# Patient Record
Sex: Female | Born: 1965 | Race: White | Hispanic: No | Marital: Married | Smoking: Former smoker
Health system: Southern US, Community
[De-identification: ages and names within clinical notes are randomized; demographics above are authoritative.]

## PROBLEM LIST (undated history)

## (undated) DIAGNOSIS — Z973 Presence of spectacles and contact lenses: Secondary | ICD-10-CM

## (undated) DIAGNOSIS — Z9889 Other specified postprocedural states: Secondary | ICD-10-CM

## (undated) DIAGNOSIS — D509 Iron deficiency anemia, unspecified: Secondary | ICD-10-CM

## (undated) DIAGNOSIS — K5909 Other constipation: Secondary | ICD-10-CM

## (undated) DIAGNOSIS — N920 Excessive and frequent menstruation with regular cycle: Secondary | ICD-10-CM

## (undated) DIAGNOSIS — G43909 Migraine, unspecified, not intractable, without status migrainosus: Secondary | ICD-10-CM

## (undated) DIAGNOSIS — D259 Leiomyoma of uterus, unspecified: Secondary | ICD-10-CM

## (undated) DIAGNOSIS — R112 Nausea with vomiting, unspecified: Secondary | ICD-10-CM

## (undated) DIAGNOSIS — M1990 Unspecified osteoarthritis, unspecified site: Secondary | ICD-10-CM

## (undated) HISTORY — PX: ABDOMINAL HYSTERECTOMY: SHX81

## (undated) HISTORY — PX: BREAST BIOPSY: SHX20

## (undated) HISTORY — DX: Leiomyoma of uterus, unspecified: D25.9

---

## 1998-10-05 ENCOUNTER — Other Ambulatory Visit: Admission: RE | Admit: 1998-10-05 | Discharge: 1998-10-05 | Payer: Self-pay | Admitting: Family Medicine

## 2000-03-13 ENCOUNTER — Other Ambulatory Visit: Admission: RE | Admit: 2000-03-13 | Discharge: 2000-03-13 | Payer: Self-pay | Admitting: Family Medicine

## 2000-03-20 ENCOUNTER — Encounter: Payer: Self-pay | Admitting: Family Medicine

## 2000-03-20 ENCOUNTER — Encounter: Admission: RE | Admit: 2000-03-20 | Discharge: 2000-03-20 | Payer: Self-pay | Admitting: Family Medicine

## 2001-05-16 ENCOUNTER — Other Ambulatory Visit: Admission: RE | Admit: 2001-05-16 | Discharge: 2001-05-16 | Payer: Self-pay | Admitting: Family Medicine

## 2003-01-29 ENCOUNTER — Other Ambulatory Visit: Admission: RE | Admit: 2003-01-29 | Discharge: 2003-01-29 | Payer: Self-pay | Admitting: Family Medicine

## 2004-06-09 ENCOUNTER — Ambulatory Visit: Payer: Self-pay | Admitting: Family Medicine

## 2005-02-16 ENCOUNTER — Ambulatory Visit: Payer: Self-pay | Admitting: Family Medicine

## 2005-06-13 ENCOUNTER — Ambulatory Visit: Payer: Self-pay | Admitting: Family Medicine

## 2005-07-11 ENCOUNTER — Encounter: Payer: Self-pay | Admitting: Family Medicine

## 2005-07-11 ENCOUNTER — Other Ambulatory Visit: Admission: RE | Admit: 2005-07-11 | Discharge: 2005-07-11 | Payer: Self-pay | Admitting: Family Medicine

## 2005-07-11 ENCOUNTER — Ambulatory Visit: Payer: Self-pay | Admitting: Family Medicine

## 2005-07-11 LAB — CONVERTED CEMR LAB: Pap Smear: NORMAL

## 2005-07-31 HISTORY — PX: COLONOSCOPY: SHX174

## 2005-08-08 ENCOUNTER — Encounter: Admission: RE | Admit: 2005-08-08 | Discharge: 2005-08-08 | Payer: Self-pay | Admitting: Family Medicine

## 2005-08-18 ENCOUNTER — Ambulatory Visit: Payer: Self-pay | Admitting: Family Medicine

## 2005-08-31 LAB — HM COLONOSCOPY

## 2005-09-07 ENCOUNTER — Ambulatory Visit: Payer: Self-pay | Admitting: Internal Medicine

## 2005-09-21 ENCOUNTER — Ambulatory Visit: Payer: Self-pay | Admitting: Internal Medicine

## 2006-06-18 LAB — FECAL OCCULT BLOOD, GUAIAC: Fecal Occult Blood: POSITIVE

## 2007-02-14 ENCOUNTER — Ambulatory Visit: Payer: Self-pay | Admitting: Family Medicine

## 2007-02-14 DIAGNOSIS — G43909 Migraine, unspecified, not intractable, without status migrainosus: Secondary | ICD-10-CM | POA: Insufficient documentation

## 2007-02-14 DIAGNOSIS — J3089 Other allergic rhinitis: Secondary | ICD-10-CM | POA: Insufficient documentation

## 2007-02-14 DIAGNOSIS — B078 Other viral warts: Secondary | ICD-10-CM | POA: Insufficient documentation

## 2007-08-16 ENCOUNTER — Telehealth (INDEPENDENT_AMBULATORY_CARE_PROVIDER_SITE_OTHER): Payer: Self-pay | Admitting: *Deleted

## 2010-01-24 ENCOUNTER — Other Ambulatory Visit: Admission: RE | Admit: 2010-01-24 | Discharge: 2010-01-24 | Payer: Self-pay | Admitting: Family Medicine

## 2010-01-24 ENCOUNTER — Ambulatory Visit: Payer: Self-pay | Admitting: Family Medicine

## 2010-01-24 DIAGNOSIS — S91309A Unspecified open wound, unspecified foot, initial encounter: Secondary | ICD-10-CM | POA: Insufficient documentation

## 2010-01-24 DIAGNOSIS — R079 Chest pain, unspecified: Secondary | ICD-10-CM

## 2010-01-24 LAB — HM PAP SMEAR

## 2010-01-28 ENCOUNTER — Encounter: Admission: RE | Admit: 2010-01-28 | Discharge: 2010-01-28 | Payer: Self-pay | Admitting: Family Medicine

## 2010-01-28 LAB — HM MAMMOGRAPHY: HM Mammogram: NORMAL

## 2010-02-03 ENCOUNTER — Encounter (INDEPENDENT_AMBULATORY_CARE_PROVIDER_SITE_OTHER): Payer: Self-pay | Admitting: *Deleted

## 2010-04-08 ENCOUNTER — Ambulatory Visit: Payer: Self-pay | Admitting: Family Medicine

## 2010-07-26 ENCOUNTER — Ambulatory Visit
Admission: RE | Admit: 2010-07-26 | Discharge: 2010-07-26 | Payer: Self-pay | Source: Home / Self Care | Attending: Internal Medicine | Admitting: Internal Medicine

## 2010-07-26 DIAGNOSIS — L570 Actinic keratosis: Secondary | ICD-10-CM

## 2010-08-28 LAB — CONVERTED CEMR LAB
ALT: 22 units/L (ref 0–35)
AST: 18 units/L (ref 0–37)
Albumin: 3.9 g/dL (ref 3.5–5.2)
Alkaline Phosphatase: 46 units/L (ref 39–117)
BUN: 15 mg/dL (ref 6–23)
Basophils Absolute: 0 10*3/uL (ref 0.0–0.1)
Basophils Relative: 0.4 % (ref 0.0–3.0)
Bilirubin, Direct: 0.1 mg/dL (ref 0.0–0.3)
CO2: 27 meq/L (ref 19–32)
Calcium: 9 mg/dL (ref 8.4–10.5)
Chloride: 107 meq/L (ref 96–112)
Cholesterol: 175 mg/dL (ref 0–200)
Creatinine, Ser: 0.9 mg/dL (ref 0.4–1.2)
Eosinophils Absolute: 0.1 10*3/uL (ref 0.0–0.7)
Eosinophils Relative: 1.7 % (ref 0.0–5.0)
GFR calc non Af Amer: 76.57 mL/min (ref >60)
Glucose, Bld: 88 mg/dL (ref 70–99)
HCT: 34.8 % — ABNORMAL LOW (ref 36.0–46.0)
HDL: 44.8 mg/dL (ref >39.00)
Hemoglobin: 11.6 g/dL — ABNORMAL LOW (ref 12.0–15.0)
LDL Cholesterol: 109 mg/dL — ABNORMAL HIGH (ref 0–99)
Lymphocytes Relative: 23.6 % (ref 12.0–46.0)
Lymphs Abs: 2.1 10*3/uL (ref 0.7–4.0)
MCHC: 33.3 g/dL (ref 30.0–36.0)
MCV: 82.1 fL (ref 78.0–100.0)
Monocytes Absolute: 0.6 10*3/uL (ref 0.1–1.0)
Monocytes Relative: 7.3 % (ref 3.0–12.0)
Neutro Abs: 5.8 10*3/uL (ref 1.4–7.7)
Neutrophils Relative %: 67 % (ref 43.0–77.0)
Platelets: 273 10*3/uL (ref 150.0–400.0)
Potassium: 5 meq/L (ref 3.5–5.1)
RBC: 4.24 M/uL (ref 3.87–5.11)
RDW: 15.1 % — ABNORMAL HIGH (ref 11.5–14.6)
Sodium: 141 meq/L (ref 135–145)
TSH: 1.63 microintl units/mL (ref 0.35–5.50)
Total Bilirubin: 0.6 mg/dL (ref 0.3–1.2)
Total CHOL/HDL Ratio: 4
Total Protein: 6.7 g/dL (ref 6.0–8.3)
Triglycerides: 108 mg/dL (ref 0.0–149.0)
VLDL: 21.6 mg/dL (ref 0.0–40.0)
WBC: 8.7 10*3/uL (ref 4.5–10.5)

## 2010-09-01 NOTE — Assessment & Plan Note (Signed)
Summary: CPX/WPAP/RBH   Vital Signs:  Patient profile:   45 year old female Height:      64.75 inches Weight:      180.75 pounds BMI:     30.42 Temp:     98.2 degrees F oral Pulse rate:   64 / minute Pulse rhythm:   regular BP sitting:   110 / 66  (left arm) Cuff size:   regular  Vitals Entered By: Lewanda Rife LPN (January 24, 2010 10:35 AM) CC: CPX with pap and breast exam LMP 01/06/10   History of Present Illness: here for health mt and gyn exam and to rev chronic med problems   is feeling ok in general   has a spot on back to check - does irritate her   occ pain in R side of chest on and off since oct  is a dull ache  not exertional at all  is a soreness she can feel every day -- feels like muscle strain -- did try to change her bra and that did help  no trauma to the area  no nausea / sweating or other symptoms not sob   lower back bothers her for 2 years - went to [facility] ortho -- did some PT -- that helped   wt is up 8 lb  bp is 110/66  colonosc 07(fam hx colon cancer)  last one was Dr Juanda Chance   pap 06-- is overdue for that    menses-- pretty normal and regular - no problems  uterine fibroids do not bother her much   mam 07-- due for her yearly self exam - no lumps   Td 04  wants to do wellness labs today- is fasting   injured foot 3 weeks ago - waterskiing -- healing abrasion  ? needs Td   Allergies: 1)  Penicillin 2)  Erythromycin 3)  Amoxicillin  Past History:  Past Surgical History: Last updated: 02/14/2007 Pelvic US- neg (02/2000) Toe fracture (12/2002) Colonoscopy- neg (08/2005)  Family History: Last updated: 02/14/2007 Father: colon cancer Mother: back surgery, breast cysts, cholesterol Siblings: 1 brother, 1 sister  Social History: Last updated: 02/14/2007 Marital Status: Married Children: 2 Occupation: dentalhygienist  Risk Factors: Smoking Status: quit (02/14/2007)  Past Medical History: uterine fibroids  Review of  Systems General:  Denies fatigue, loss of appetite, and malaise. Eyes:  Denies blurring and eye pain. CV:  Complains of chest pain or discomfort; denies lightheadness, palpitations, and shortness of breath with exertion. Resp:  Denies cough, shortness of breath, and wheezing. GI:  Denies abdominal pain and indigestion. GU:  Denies abnormal vaginal bleeding, dysuria, and urinary frequency. MS:  Denies joint pain, joint redness, and joint swelling. Derm:  Denies itching, lesion(s), poor wound healing, and rash. Neuro:  Denies numbness and tingling. Psych:  Denies anxiety and depression. Endo:  Denies cold intolerance, excessive thirst, excessive urination, and heat intolerance. Heme:  Denies abnormal bruising and bleeding.  Physical Exam  General:  overweight but generally well appearing  Head:  normocephalic, atraumatic, and no abnormalities observed.   Eyes:  vision grossly intact, pupils equal, pupils round, and pupils reactive to light.  no conjunctival pallor, injection or icterus  Ears:  R ear normal and L ear normal.   Nose:  no nasal discharge.   Mouth:  pharynx pink and moist.   Neck:  supple with full rom and no masses or thyromegally, no JVD or carotid bruit  Chest Wall:  some tenderness over ant R chest  wall - no crepitice or skin change  Breasts:  No mass, nodules, thickening, tenderness, bulging, retraction, inflamation, nipple discharge or skin changes noted.   Lungs:  Normal respiratory effort, chest expands symmetrically. Lungs are clear to auscultation, no crackles or wheezes. Heart:  Normal rate and regular rhythm. S1 and S2 normal without gallop, murmur, click, rub or other extra sounds. Abdomen:  Bowel sounds positive,abdomen soft and non-tender without masses, organomegaly or hernias noted. Genitalia:  Normal introitus for age, no external lesions, no vaginal discharge, mucosa pink and moist, no vaginal or cervical lesions, no vaginal atrophy, no friaility or  hemorrhage, normal uterus size and position, no adnexal masses or tenderness uterine fibroids noted to left  Msk:  No deformity or scoliosis noted of thoracic or lumbar spine.  no acute joint changes  Pulses:  R and L carotid,radial,femoral,dorsalis pedis and posterior tibial pulses are full and equal bilaterally Extremities:  No clubbing, cyanosis, edema, or deformity noted with normal full range of motion of all joints.   Neurologic:  sensation intact to light touch, gait normal, and DTRs symmetrical and normal.   Skin:  Intact without suspicious lesions or rashes lentigos diffusely  dermatofibroma 2-3 mm R back   some patches of dry skin on hips bilat - baseline Cervical Nodes:  No lymphadenopathy noted Axillary Nodes:  No palpable lymphadenopathy Inguinal Nodes:  No significant adenopathy Psych:  normal affect, talkative and pleasant    Impression & Recommendations:  Problem # 1:  HEALTH MAINTENANCE EXAM (ICD-V70.0) Assessment Comment Only reviewed health habits including diet, exercise and skin cancer prevention reviewed health maintenance list and family history  wellness labs today Orders: Venipuncture (78469) TLB-Lipid Panel (80061-LIPID) TLB-BMP (Basic Metabolic Panel-BMET) (80048-METABOL) TLB-CBC Platelet - w/Differential (85025-CBCD) TLB-Hepatic/Liver Function Pnl (80076-HEPATIC) TLB-TSH (Thyroid Stimulating Hormone) (84443-TSH)  Problem # 2:  ROUTINE GYNECOLOGICAL EXAMINATION (ICD-V72.31) Assessment: Comment Only annual exam and pap  fibroids not symptomatic   Problem # 3:  WOUND, FOOT (ICD-892.0) Assessment: New looks to be healing well  update Td adv keeping clean and use of abx oint otc   Problem # 4:  CHEST PAIN (ICD-786.50) Assessment: New  with tenderness/ suspect muscular  nl ekg and exam  adv use of heat/ stretches and update if worse/ no imp or other new symptoms   Orders: EKG w/ Interpretation (93000)  Complete Medication List: 1)  Allegra  60 Mg Tabs (Fexofenadine hcl) .... One by mouth daily prn 2)  Midrin 325-65-100 Mg Caps (Apap-isometheptene-dichloral) .Marland Kitchen.. 1-2 pills q 6 hours as needed migraine 3)  Ibuprofen 200 Mg Tabs (Ibuprofen) .... Otc as directed.  Other Orders: Radiology Referral (Radiology) TD Toxoids IM 7 YR + [phone]) Admin 1st Vaccine (84132)  Patient Instructions: 1)  tetnus shot today  2)  keep wound on foot clean and dry / use over the counter antibiotic ointment until healed  3)  we will set up mammogram at check out  4)  labs today for wellness  5)  work on healthy diet and exercise  6)  schedule follow up for wart treatment please   Current Allergies (reviewed today): PENICILLIN ERYTHROMYCIN AMOXICILLIN    EKG  Procedure date:  01/24/2010  Findings:      NSR with rate of 64 and no acute changes     Immunizations Administered:  Tetanus Vaccine:    Vaccine Type: Td    Site: left deltoid    Mfr: Sanofi Pasteur    Dose: 0.5 ml    Route: IM  Given by: Lewanda Rife LPN    Exp. Date: 08/26/2011    Lot #: [id number]    VIS given: 06/18/07 version given January 24, 2010.

## 2010-09-01 NOTE — Letter (Signed)
Summary: Results Follow up Letter   at [facility]  [address] [place], [place] [address]   Phone: [phone]  Fax: [phone]    02/03/2010 MRN: [id number]    LAQUASIA PINCUS [address] [place], [place]  [address]    Dear Ms. Scriven,  The following are the results of your recent test(s):  Test         Result    Pap Smear:        Normal _____  Not Normal _____ Comments: ______________________________________________________ Cholesterol: LDL(Bad cholesterol):         Your goal is less than:         HDL (Good cholesterol):       Your goal is more than: Comments:  ______________________________________________________ Mammogram:        Normal __X___  Not Normal _____ Comments:  Yearly follow up is recommended.   ___________________________________________________________________ Hemoccult:        Normal _____  Not normal _______ Comments:    _____________________________________________________________________ Other Tests:    We routinely do not discuss normal results over the telephone.  If you desire a copy of the results, or you have any questions about this information we can discuss them at your next office visit.   Sincerely,   Marne A. Milinda Antis, M.D.  MAT:lsf

## 2010-09-01 NOTE — Letter (Signed)
Summary: Results Follow up Letter   at [facility]  [address] [place], [place] [address]   Phone: [phone]  Fax: [phone]    02/03/2010 MRN: [id number]  CRISTAN HOUT [address] [place], [place]  [address]  Dear Ms. Hevia,  The following are the results of your recent test(s):  Test         Result    Pap Smear:        Normal __X___  Not Normal _____ Comments: ______________________________________________________ Cholesterol: LDL(Bad cholesterol):         Your goal is less than:         HDL (Good cholesterol):       Your goal is more than: Comments:  ______________________________________________________ Mammogram:        Normal _____  Not Normal _____ Comments:  ___________________________________________________________________ Hemoccult:        Normal _____  Not normal _______ Comments:    _____________________________________________________________________ Other Tests:    We routinely do not discuss normal results over the telephone.  If you desire a copy of the results, or you have any questions about this information we can discuss them at your next office visit.   Sincerely,     Roxy Manns, MD

## 2010-09-01 NOTE — Assessment & Plan Note (Signed)
Summary: 9:45 CHECK PLACE ON CHEST/CLE   Vital Signs:  Patient profile:   45 year old female Height:      64.75 inches Weight:      184.25 pounds BMI:     31.01 Temp:     98.4 degrees F oral Pulse rate:   84 / minute Pulse rhythm:   regular BP sitting:   110 / 70  (left arm) Cuff size:   regular  Vitals Entered By: Delilah Shan CMA Duncan Dull) (July 26, 2010 9:51 AM) CC: Check place on chest   History of Present Illness: Has mole on upper chest was formerly a mole with a halo now has changed and has scaly area on it "Looked like a blister with a hole in it" Very itchy some redness  tried to cover it with bandaid no other Rx  Allergies: 1)  Penicillin 2)  Erythromycin 3)  Amoxicillin  Past History:  Past medical, surgical, family and social histories (including risk factors) reviewed for relevance to current acute and chronic problems.  Past Medical History: Reviewed history from 01/24/2010 and no changes required. uterine fibroids  Past Surgical History: Reviewed history from 02/14/2007 and no changes required. Pelvic US- neg (02/2000) Toe fracture (12/2002) Colonoscopy- neg (08/2005)  Family History: Reviewed history from 02/14/2007 and no changes required. Father: colon cancer Mother: back surgery, breast cysts, cholesterol Siblings: 1 brother, 1 sister  Social History: Reviewed history from 02/14/2007 and no changes required. Marital Status: Married Children: 2 Occupation: dentalhygienist  Review of Systems       No history of skin cancer but did have spot under eye removed as child wears sun block all the time  Physical Exam  Skin:   ~42mm scaly red area on upper chest slight darker discoloration to right    Impression & Recommendations:  Problem # 1:  ACTINIC KERATOSIS (ICD-702.0) Assessment New  Discussed alternatives Rx done with liquid nitrogen 50 seconds x 2 tolerated well discussed home care  Orders: Cryotherapy/Destruction  benign or premalignant lesion (1st lesion)  (17000)  Complete Medication List: 1)  Allegra 60 Mg Tabs (Fexofenadine hcl) .... One by mouth daily prn 2)  Midrin 325-65-100 Mg Caps (Apap-isometheptene-dichloral) .Marland Kitchen.. 1-2 pills q 6 hours as needed migraine 3)  Ibuprofen 200 Mg Tabs (Ibuprofen) .... Otc as directed.  Patient Instructions: 1)  Please schedule a follow-up appointment as needed .    Orders Added: 1)  Cryotherapy/Destruction benign or premalignant lesion (1st lesion)  [17000]    Current Allergies (reviewed today): PENICILLIN ERYTHROMYCIN AMOXICILLIN

## 2010-09-01 NOTE — Assessment & Plan Note (Signed)
Summary: WART TREATMENT / LFW   Vital Signs:  Patient profile:   45 year old female Weight:      182.75 pounds Temp:     98.0 degrees F oral Pulse rate:   86 / minute Pulse rhythm:   regular BP sitting:   102 / 62  (left arm) Cuff size:   large  Vitals Entered By: Selena Batten Dance CMA Duncan Dull) (April 08, 2010 1:56 PM) CC: wart treatment   History of Present Illness: wart is L foot -- is starting to hurt  has had it for well over a year  she trims it down - but no otc products   sore if she walks a lot there are several on either foot  does go barefoot in public   Allergies: 1)  Penicillin 2)  Erythromycin 3)  Amoxicillin  Past History:  Past Medical History: Last updated: 01/24/2010 uterine fibroids  Past Surgical History: Last updated: 02/14/2007 Pelvic US- neg (02/2000) Toe fracture (12/2002) Colonoscopy- neg (08/2005)  Family History: Last updated: 02/14/2007 Father: colon cancer Mother: back surgery, breast cysts, cholesterol Siblings: 1 brother, 1 sister  Social History: Last updated: 02/14/2007 Marital Status: Married Children: 2 Occupation: dentalhygienist  Risk Factors: Smoking Status: quit (02/14/2007)  Review of Systems General:  Denies chills, fatigue, fever, and loss of appetite. CV:  Denies chest pain or discomfort and palpitations. Resp:  Denies cough, shortness of breath, and wheezing. GI:  Denies abdominal pain, change in bowel habits, and indigestion. GU:  Denies genital sores. MS:  Denies joint pain. Derm:  Complains of lesion(s); denies poor wound healing and rash. Heme:  Denies abnormal bruising and bleeding.  Physical Exam  General:  Well-developed,well-nourished,in no acute distress; alert,appropriate and cooperative throughout examination Head:  normocephalic, atraumatic, and no abnormalities observed.   Mouth:  pharynx pink and moist.   Neck:  supple with full rom and no masses or thyromegally, no JVD or carotid bruit    Lungs:  Normal respiratory effort, chest expands symmetrically. Lungs are clear to auscultation, no crackles or wheezes. Heart:  Normal rate and regular rhythm. S1 and S2 normal without gallop, murmur, click, rub or other extra sounds. Skin:  4 plantar warts on surface of L foot these were debrided sterilly with #11 scalpel  then cryo tx with liquid nitrogen freeze and thaw times 3 full (tolerated well) Psych:  normal affect, talkative and pleasant    Impression & Recommendations:  Problem # 1:  PLANTAR WART (ICD-078.19) Assessment Deteriorated  bilat feet - decided to just tx the R foot today  cleaned and sterilly debided 4 plantar warts  tx with liquid nit cryo times 3 full freeze and thaw - pt tolerated well if not resolved in 2 mo will f/u for re treatment disc aftercare in detail  Orders: Wart Destruct <14 ([address])  Complete Medication List: 1)  Allegra 60 Mg Tabs (Fexofenadine hcl) .... One by mouth daily prn 2)  Midrin 325-65-100 Mg Caps (Apap-isometheptene-dichloral) .Marland Kitchen.. 1-2 pills q 6 hours as needed migraine 3)  Ibuprofen 200 Mg Tabs (Ibuprofen) .... Otc as directed.  Patient Instructions: 1)  keep the wart sites clean  2)  use antibiotic ointment if necessary  3)  once healed - work on them with clean pumice stone or foot file after soaking with soap and water  4)  if warts are not gone in 1-2 months follow up for re - treatment  Current Allergies (reviewed today): PENICILLIN ERYTHROMYCIN AMOXICILLIN

## 2011-04-14 ENCOUNTER — Encounter: Payer: Self-pay | Admitting: Internal Medicine

## 2011-04-14 ENCOUNTER — Ambulatory Visit (INDEPENDENT_AMBULATORY_CARE_PROVIDER_SITE_OTHER): Payer: 59 | Admitting: Internal Medicine

## 2011-04-14 VITALS — BP 99/54 | HR 67 | Temp 98.9°F | Wt 177.0 lb

## 2011-04-14 DIAGNOSIS — N390 Urinary tract infection, site not specified: Secondary | ICD-10-CM | POA: Insufficient documentation

## 2011-04-14 DIAGNOSIS — R300 Dysuria: Secondary | ICD-10-CM

## 2011-04-14 LAB — POCT URINALYSIS DIPSTICK
Glucose, UA: NEGATIVE
Ketones, UA: NEGATIVE
Spec Grav, UA: 1.03

## 2011-04-14 MED ORDER — CIPROFLOXACIN HCL 250 MG PO TABS: 250.0000 mg | ORAL_TABLET | Freq: Two times a day (BID) | ORAL | Status: AC

## 2011-04-14 NOTE — Assessment & Plan Note (Signed)
Marked blood and pyuria in urine No sig systemic symptoms Will treat with cipro (has tolerated before) for 10 days She will use the cystex prn

## 2011-04-14 NOTE — Progress Notes (Signed)
  Subjective:    Patient ID: Katie Espinoza, female    DOB: [birth year], 45 y.o.   MRN: [id number]  HPI 5 days ago she noted urinary irritation Had taken a lavender bath  Tried azo Felt better for a while but doubled over in pain today Mild nausea Gross blood in urine  Dysuria and urgency since then  No fever Slight chills and felt sweaty No back pain out of her usual  No current outpatient prescriptions on file prior to visit.    Allergies  Allergen Reactions  . Amoxicillin     REACTION: reaction not known  . Erythromycin     REACTION: reaction not known  . Penicillins     REACTION: reaction not known    Past Medical History  Diagnosis Date  . Uterine fibroid     Past Surgical History  Procedure Date  . Orif toe fracture     Family History  Problem Relation Age of Onset  . Cancer Father     colon    History   Social History  . Marital Status: Married    Spouse Name: N/A    Number of Children: 2  . Years of Education: N/A   Occupational History  . dental hygenist    Social History Main Topics  . Smoking status: Never Smoker   . Smokeless tobacco: Never Used  . Alcohol Use: No  . Drug Use: No  . Sexually Active: Not on file   Other Topics Concern  . Not on file   Social History Narrative  . No narrative on file   Review of Systems No vomiting Able to eat okay Did call into work    Objective:   Physical Exam  Constitutional: She appears well-developed and well-nourished. No distress.  Abdominal: Soft. There is tenderness.       Very mild but not localized tenderness  Musculoskeletal:       No CVA tenderness          Assessment & Plan:

## 2011-05-01 ENCOUNTER — Telehealth: Payer: Self-pay | Admitting: Family Medicine

## 2011-05-01 DIAGNOSIS — Z0000 Encounter for general adult medical examination without abnormal findings: Secondary | ICD-10-CM | POA: Insufficient documentation

## 2011-05-01 NOTE — Telephone Encounter (Signed)
Message copied by [name] on Mon May 01, 2011  9:37 PM ------      Message from: Baldomero Lamy      Created: Mon May 01, 2011 10:04 AM      Regarding: Cpx labs Tues 10/2       Please order  future cpx labs for pt's upcomming lab appt.      Thanks      Rodney Booze

## 2011-05-02 ENCOUNTER — Other Ambulatory Visit (INDEPENDENT_AMBULATORY_CARE_PROVIDER_SITE_OTHER): Payer: 59

## 2011-05-02 DIAGNOSIS — Z0000 Encounter for general adult medical examination without abnormal findings: Secondary | ICD-10-CM

## 2011-05-02 LAB — CBC WITH DIFFERENTIAL/PLATELET
Basophils Absolute: 0 10*3/uL (ref 0.0–0.1)
Eosinophils Absolute: 0.1 10*3/uL (ref 0.0–0.7)
HCT: 33.5 % — ABNORMAL LOW (ref 36.0–46.0)
Hemoglobin: 10.7 g/dL — ABNORMAL LOW (ref 12.0–15.0)
Lymphs Abs: 1.6 10*3/uL (ref 0.7–4.0)
MCHC: 32 g/dL (ref 30.0–36.0)
Monocytes Relative: 8.4 % (ref 3.0–12.0)
Neutro Abs: 4.8 10*3/uL (ref 1.4–7.7)
RDW: 15.9 % — ABNORMAL HIGH (ref 11.5–14.6)

## 2011-05-02 LAB — COMPREHENSIVE METABOLIC PANEL
ALT: 23 U/L (ref 0–35)
BUN: 13 mg/dL (ref 6–23)
CO2: 27 mEq/L (ref 19–32)
Creatinine, Ser: 1 mg/dL (ref 0.4–1.2)
GFR: 66.25 mL/min (ref >60.00)
Total Bilirubin: 0.5 mg/dL (ref 0.3–1.2)

## 2011-05-02 LAB — LIPID PANEL
HDL: 47.5 mg/dL (ref >39.00)
Triglycerides: 114 mg/dL (ref 0.0–149.0)
VLDL: 22.8 mg/dL (ref 0.0–40.0)

## 2011-05-02 LAB — TSH: TSH: 1.25 u[IU]/mL (ref 0.35–5.50)

## 2011-05-05 ENCOUNTER — Encounter: Payer: Self-pay | Admitting: Family Medicine

## 2011-05-08 ENCOUNTER — Telehealth: Payer: Self-pay | Admitting: Family Medicine

## 2011-05-08 ENCOUNTER — Other Ambulatory Visit (HOSPITAL_COMMUNITY)
Admission: RE | Admit: 2011-05-08 | Discharge: 2011-05-08 | Disposition: A | Discharge status: Home / Self Care | Payer: 59 | Source: Ambulatory Visit | Attending: Family Medicine | Admitting: Family Medicine

## 2011-05-08 ENCOUNTER — Ambulatory Visit (INDEPENDENT_AMBULATORY_CARE_PROVIDER_SITE_OTHER): Payer: 59 | Admitting: Family Medicine

## 2011-05-08 ENCOUNTER — Encounter: Payer: Self-pay | Admitting: Family Medicine

## 2011-05-08 DIAGNOSIS — Z01419 Encounter for gynecological examination (general) (routine) without abnormal findings: Secondary | ICD-10-CM | POA: Insufficient documentation

## 2011-05-08 DIAGNOSIS — Z800 Family history of malignant neoplasm of digestive organs: Secondary | ICD-10-CM | POA: Insufficient documentation

## 2011-05-08 DIAGNOSIS — D509 Iron deficiency anemia, unspecified: Secondary | ICD-10-CM | POA: Insufficient documentation

## 2011-05-08 DIAGNOSIS — D649 Anemia, unspecified: Secondary | ICD-10-CM

## 2011-05-08 DIAGNOSIS — Z0000 Encounter for general adult medical examination without abnormal findings: Secondary | ICD-10-CM

## 2011-05-08 DIAGNOSIS — G43909 Migraine, unspecified, not intractable, without status migrainosus: Secondary | ICD-10-CM

## 2011-05-08 MED ORDER — POLYSACCHARIDE IRON COMPLEX 150 MG PO CAPS: 150.0000 mg | ORAL_CAPSULE | Freq: Every day | ORAL | Status: DC

## 2011-05-08 MED ORDER — ISOMETHEPTENE-APAP-DICHLORAL 65-325-100 MG PO CAPS: 1.0000 | ORAL_CAPSULE | Freq: Four times a day (QID) | ORAL | Status: DC | PRN

## 2011-05-08 NOTE — Assessment & Plan Note (Addendum)
Due for her 5 year colonoscopy  Will get that scheduled for Dr Juanda Chance No stool changes  ADDENDUM:-- called Dr Regino Schultze office and turns out she does not recommend colonosc until 2019  So will cancel the order

## 2011-05-08 NOTE — Progress Notes (Signed)
Subjective:    Patient ID: Katie Espinoza, female    DOB: [birth year], 45 y.o.   MRN: [id number]  HPI Here for annual health mt exam and to rev chronic med problems and also disc anemia  Is feeling good overall   occ has a "flip" in her heartbeat  Only notices it when she sits still  Drinks caffiene - she drinks occ tea , avoids soft drinks (at home decaf)  Not more anxious   bp good 100/68 Wt is up 2 lb with bmi of 29  Lipids - LDL is 93 good profile Lab Results  Component Value Date   CHOL 163 05/02/2011   CHOL 175 01/24/2010   Lab Results  Component Value Date   HDL 47.50 05/02/2011   HDL 62.13 01/24/2010   Lab Results  Component Value Date   LDLCALC 93 05/02/2011   LDLCALC 109* 01/24/2010   Lab Results  Component Value Date   TRIG 114.0 05/02/2011   TRIG 108.0 01/24/2010   Lab Results  Component Value Date   CHOLHDL 3 05/02/2011   CHOLHDL 4 01/24/2010   No results found for this basename: LDLDIRECT  does eat a pretty healthy diet in general Recently stopped eating out at lunch - and feels better  Though- has not been walking as much- feels better when she does  Kids are in sports -- does a lot of sitting and watching games  This will end next week   New? Anemia with 10.7 hb Lab Results  Component Value Date   WBC 7.1 05/02/2011   HGB 10.7* 05/02/2011   HCT 33.5* 05/02/2011   MCV 79.1 05/02/2011   PLT 305.0 05/02/2011  from heavy menses and has been anemic before  Is somewhat tired Wonders if palpitations may be assoc with this    Pap 6/11- were normal  Menses- are very heavy  Hx of fibroids - and this makes her bleed heavy  No OC - does not want one   colonosc 2/07- sent her a notice to schedule  Father had colon cancer  Flu shot Td 6/1  Mam 7/11-- knows she is due -- she will set it up herself  Self exam no lumps or changes   Gets free flu shot at work in nov    Patient Active Problem List  Diagnoses  . MIGRAINE HEADACHE  . RHINITIS, ALLERGIC  NEC  . ACTINIC KERATOSIS  . Routine general medical examination at a health care facility  . Anemia  . Family history of colon cancer  . Gynecological examination   Past Medical History  Diagnosis Date  . Uterine fibroid    Past Surgical History  Procedure Date  . Orif toe fracture    History  Substance Use Topics  . Smoking status: Never Smoker   . Smokeless tobacco: Never Used  . Alcohol Use: No   Family History  Problem Relation Age of Onset  . Cancer Father     colon  . Hyperlipidemia Mother    Allergies  Allergen Reactions  . Amoxicillin     REACTION: reaction not known  . Erythromycin     REACTION: reaction not known  . Penicillins     REACTION: reaction not known   Current Outpatient Prescriptions on File Prior to Visit  Medication Sig Dispense Refill  . fexofenadine (ALLEGRA) 60 MG tablet Take 60 mg by mouth daily as needed.        Marland Kitchen ibuprofen (ADVIL,MOTRIN) 200 MG tablet Take 200  mg by mouth every 6 (six) hours as needed.             Review of Systems Review of Systems  Constitutional: Negative for fever, appetite change, and unexpected weight change. some fatigue  Eyes: Negative for pain and visual disturbance.  Respiratory: Negative for cough and shortness of breath.   Cardiovascular: Negative for cp or edema or sob    Gastrointestinal: Negative for nausea, diarrhea and constipation.  Genitourinary: Negative for urgency and frequency.  Skin: Negative for pallor or rash   Neurological: Negative for weakness, light-headedness, numbness and headaches.  Hematological: Negative for adenopathy. Does not bruise/bleed easily.  Psychiatric/Behavioral: Negative for dysphoric mood. The patient is not nervous/anxious.          Objective:   Physical Exam  Constitutional: She appears well-developed and well-nourished. No distress.  HENT:  Head: Normocephalic and atraumatic.  Right Ear: External ear normal.  Left Ear: External ear normal.  Nose: Nose  normal.  Mouth/Throat: Oropharynx is clear and moist.  Eyes: Conjunctivae and EOM are normal. Pupils are equal, round, and reactive to light. No scleral icterus.  Neck: Normal range of motion. Neck supple. No JVD present. Carotid bruit is not present. No thyromegaly present.  Cardiovascular: Normal rate, regular rhythm, normal heart sounds and intact distal pulses.  Exam reveals no gallop.   No murmur heard. Pulmonary/Chest: Effort normal and breath sounds normal. No respiratory distress. She has no wheezes.  Abdominal: Soft. Bowel sounds are normal. She exhibits no distension and no mass. There is no tenderness.  Genitourinary: Vagina normal. No breast swelling, tenderness, discharge or bleeding. No vaginal discharge found.       Uterus is mildly enlarged due to fibroids Pt had no pain or bleeding with exam   Musculoskeletal: Normal range of motion. She exhibits no edema and no tenderness.  Lymphadenopathy:    She has no cervical adenopathy.  Neurological: She is alert. She has normal reflexes. No cranial nerve deficit. Coordination normal.  Skin: Skin is warm and dry. No rash noted. No erythema. No pallor.  Psychiatric: She has a normal mood and affect.          Assessment & Plan:

## 2011-05-08 NOTE — Telephone Encounter (Signed)
Called [facility] to set up the Colonoscopy. They said Dr Lina Sar recommended the next one be in 2019 so they did not schedule her for the procedure. They said they will send her a letter when she is due.

## 2011-05-08 NOTE — Assessment & Plan Note (Signed)
Suspect from heavy menses/ fibroids Start nu iron daily  Lab and f/u in 6 weeks  Urged to update if side eff or problems

## 2011-05-08 NOTE — Patient Instructions (Signed)
Try to avoid caffeine as much as possible  Start Nu iron px once daily - also eat iron rich foods (lean beef/ dark leafy greens) We will refer you for colonoscopy at check out  Don't forget to schedule your mammogram  Schedule follow up in 6 weeks for anemia with lab prior

## 2011-05-08 NOTE — Assessment & Plan Note (Signed)
Refilled her migraine med which she is needing less and less

## 2011-05-08 NOTE — Assessment & Plan Note (Signed)
Pap done and exam  Mildly enl uterus from fibroids - no pain  Pt deals well with heavy menses- does not want any proceedures or OC at this time- will just tx anemia with iron

## 2011-05-08 NOTE — Telephone Encounter (Signed)
Thanks- that is good news -- do you need me to go in my note and cancel the order?

## 2011-05-08 NOTE — Assessment & Plan Note (Signed)
Reviewed health habits including diet and exercise and skin cancer prevention Also reviewed health mt list, fam hx and immunizations  Will get flu shot at work [facility] in detail

## 2011-05-09 NOTE — Telephone Encounter (Signed)
No I cancelled your order!

## 2011-05-12 ENCOUNTER — Encounter: Payer: Self-pay | Admitting: *Deleted

## 2011-06-13 ENCOUNTER — Other Ambulatory Visit (INDEPENDENT_AMBULATORY_CARE_PROVIDER_SITE_OTHER): Payer: 59

## 2011-06-13 DIAGNOSIS — D649 Anemia, unspecified: Secondary | ICD-10-CM

## 2011-06-13 LAB — CBC WITH DIFFERENTIAL/PLATELET
Basophils Absolute: 0 10*3/uL (ref 0.0–0.1)
Eosinophils Absolute: 0.1 10*3/uL (ref 0.0–0.7)
Hemoglobin: 10.9 g/dL — ABNORMAL LOW (ref 12.0–15.0)
Lymphocytes Relative: 25.7 % (ref 12.0–46.0)
MCHC: 32.8 g/dL (ref 30.0–36.0)
Neutro Abs: 4.3 10*3/uL (ref 1.4–7.7)
Neutrophils Relative %: 65.5 % (ref 43.0–77.0)
Platelets: 271 10*3/uL (ref 150.0–400.0)
RDW: 16.7 % — ABNORMAL HIGH (ref 11.5–14.6)

## 2011-06-19 ENCOUNTER — Ambulatory Visit (INDEPENDENT_AMBULATORY_CARE_PROVIDER_SITE_OTHER): Payer: 59 | Admitting: Family Medicine

## 2011-06-19 ENCOUNTER — Encounter: Payer: Self-pay | Admitting: Family Medicine

## 2011-06-19 VITALS — BP 100/68 | HR 84 | Temp 98.2°F | Ht 65.0 in | Wt 180.5 lb

## 2011-06-19 DIAGNOSIS — D649 Anemia, unspecified: Secondary | ICD-10-CM

## 2011-06-19 MED ORDER — POLYSACCHARIDE IRON COMPLEX 150 MG PO CAPS: 150.0000 mg | ORAL_CAPSULE | Freq: Two times a day (BID) | ORAL | Status: DC

## 2011-06-19 NOTE — Progress Notes (Signed)
Subjective:    Patient ID: Katie Espinoza, female    DOB: 1965/09/21, 45 y.o.   MRN: [id number]  HPI Here for f/u of anemia - presumed from heavy menses Last visit 6 wk ago - put on NU IRON qd 150 mg  Now hb only slt imp at 10.9 (from 10.7) Ferritin low at 5.2  Nu iron made her a bit constipated   She is tired most all the time  She tolerates her menses ok - does not want intervention   Started menses a week early - is irregular in perimenopause as well   Patient Active Problem List  Diagnoses  . MIGRAINE HEADACHE  . RHINITIS, ALLERGIC NEC  . ACTINIC KERATOSIS  . Routine general medical examination at a health care facility  . Anemia  . Family history of colon cancer  . Gynecological examination   Past Medical History  Diagnosis Date  . Uterine fibroid    Past Surgical History  Procedure Date  . Orif toe fracture    History  Substance Use Topics  . Smoking status: Former Smoker    Types: Cigarettes    Quit date: 07/31/1990  . Smokeless tobacco: Never Used  . Alcohol Use: No   Family History  Problem Relation Age of Onset  . Cancer Father     colon  . Hyperlipidemia Mother    Allergies  Allergen Reactions  . Amoxicillin     REACTION: reaction not known  . Erythromycin     REACTION: reaction not known  . Penicillins     REACTION: reaction not known   Current Outpatient Prescriptions on File Prior to Visit  Medication Sig Dispense Refill  . fexofenadine (ALLEGRA) 60 MG tablet Take 60 mg by mouth daily as needed.        Marland Kitchen ibuprofen (ADVIL,MOTRIN) 200 MG tablet Take 200 mg by mouth every 6 (six) hours as needed.        . isometheptene-acetaminophen-dichloralphenazone (MIDRIN) 65-325-100 MG capsule Take 1-2 capsules by mouth 4 (four) times daily as needed.  15 capsule  1      Review of Systems Review of Systems  Constitutional: Negative for fever, appetite change,  and unexpected weight change. pos for fatigue  Eyes: Negative for pain and visual  disturbance.  Respiratory: Negative for cough and shortness of breath.   Cardiovascular: Negative for cp or palpitations    Gastrointestinal: Negative for nausea, diarrhea and constipation.  Genitourinary: Negative for urgency and frequency.  Skin: Negative for pallor or rash   Neurological: Negative for weakness, light-headedness, numbness and headaches.  Hematological: Negative for adenopathy. Does not bruise/bleed easily.  Psychiatric/Behavioral: Negative for dysphoric mood. The patient is not nervous/anxious.          Objective:   Physical Exam  Constitutional: She appears well-developed and well-nourished.  HENT:  Head: Normocephalic and atraumatic.  Mouth/Throat: Oropharynx is clear and moist.  Eyes: Conjunctivae and EOM are normal. Pupils are equal, round, and reactive to light.       No conj pallor  Neck: Normal range of motion. Neck supple. No JVD present. No thyromegaly present.  Cardiovascular: Normal rate, regular rhythm and normal heart sounds.   Pulmonary/Chest: Effort normal and breath sounds normal.  Abdominal: Soft. Bowel sounds are normal. She exhibits no distension and no mass. There is no tenderness.       No suprapubic tenderness    Musculoskeletal: She exhibits no edema.  Lymphadenopathy:    She has no cervical adenopathy.  Neurological: She is alert. She has normal reflexes.  Skin: Skin is warm and dry. No rash noted. No erythema. No pallor.  Psychiatric: She has a normal mood and affect.          Assessment & Plan:

## 2011-06-19 NOTE — Assessment & Plan Note (Signed)
Iron deficient anemia with stable HB and also low ferritin - again suspect from menses Will inc nu iron to bid for 6 weeks and re check labs  If not imp - may need to consider poss of iron absorption problem Pt is fatigued as well

## 2011-06-19 NOTE — Patient Instructions (Signed)
Increase the nu iron to twice daily Take colace over the counter for constipation once to twice daily as needed Keep up good fluid intake  Schedule non fasting lab 6 weeks

## 2011-07-31 ENCOUNTER — Other Ambulatory Visit: Payer: 59

## 2012-04-08 ENCOUNTER — Telehealth: Payer: Self-pay | Admitting: Family Medicine

## 2012-04-08 NOTE — Telephone Encounter (Signed)
Yes- please put her in any 30 minute slot you can come up with ,thanks

## 2012-04-08 NOTE — Telephone Encounter (Signed)
Patient has changed insurance and needs her physical before the calendar year is out, but your next physical appt is in Feb.  She wants to now if you can fit her in somewhere before the end of the year.

## 2012-05-06 ENCOUNTER — Telehealth: Payer: Self-pay | Admitting: Family Medicine

## 2012-05-06 DIAGNOSIS — Z0000 Encounter for general adult medical examination without abnormal findings: Secondary | ICD-10-CM

## 2012-05-06 NOTE — Telephone Encounter (Signed)
Message copied by [name] on Mon May 06, 2012  9:22 PM ------      Message from: [place], [place] J      Created: Thu May 02, 2012 12:32 PM      Regarding: lab orders for Tuesday, 10.8.13       Patient is scheduled for CPX labs, please order future labs, Thanks , Camelia Eng

## 2012-05-07 ENCOUNTER — Other Ambulatory Visit (INDEPENDENT_AMBULATORY_CARE_PROVIDER_SITE_OTHER): Payer: 59

## 2012-05-07 DIAGNOSIS — D649 Anemia, unspecified: Secondary | ICD-10-CM

## 2012-05-07 DIAGNOSIS — Z0000 Encounter for general adult medical examination without abnormal findings: Secondary | ICD-10-CM

## 2012-05-07 LAB — TSH: TSH: 2.23 u[IU]/mL (ref 0.35–5.50)

## 2012-05-07 LAB — CBC WITH DIFFERENTIAL/PLATELET
Basophils Relative: 0.7 % (ref 0.0–3.0)
Eosinophils Relative: 2.4 % (ref 0.0–5.0)
HCT: 31.6 % — ABNORMAL LOW (ref 36.0–46.0)
MCV: 77.4 fl — ABNORMAL LOW (ref 78.0–100.0)
Monocytes Absolute: 0.5 10*3/uL (ref 0.1–1.0)
Monocytes Relative: 7.9 % (ref 3.0–12.0)
Neutrophils Relative %: 65.3 % (ref 43.0–77.0)
RBC: 4.09 Mil/uL (ref 3.87–5.11)
WBC: 5.9 10*3/uL (ref 4.5–10.5)

## 2012-05-07 LAB — COMPREHENSIVE METABOLIC PANEL
Albumin: 3.4 g/dL — ABNORMAL LOW (ref 3.5–5.2)
CO2: 26 mEq/L (ref 19–32)
Calcium: 8.4 mg/dL (ref 8.4–10.5)
Chloride: 108 mEq/L (ref 96–112)
GFR: 69.23 mL/min (ref >60.00)
Glucose, Bld: 94 mg/dL (ref 70–99)
Sodium: 139 mEq/L (ref 135–145)
Total Bilirubin: 0.4 mg/dL (ref 0.3–1.2)
Total Protein: 6.5 g/dL (ref 6.0–8.3)

## 2012-05-07 LAB — LIPID PANEL
Cholesterol: 158 mg/dL (ref 0–200)
VLDL: 15.8 mg/dL (ref 0.0–40.0)

## 2012-05-13 ENCOUNTER — Encounter: Payer: 59 | Admitting: Family Medicine

## 2012-05-17 ENCOUNTER — Other Ambulatory Visit (HOSPITAL_COMMUNITY)
Admission: RE | Admit: 2012-05-17 | Discharge: 2012-05-17 | Disposition: A | Discharge status: Home / Self Care | Payer: 59 | Source: Ambulatory Visit | Attending: Family Medicine | Admitting: Family Medicine

## 2012-05-17 ENCOUNTER — Ambulatory Visit (INDEPENDENT_AMBULATORY_CARE_PROVIDER_SITE_OTHER): Payer: 59 | Admitting: Family Medicine

## 2012-05-17 ENCOUNTER — Encounter: Payer: Self-pay | Admitting: Family Medicine

## 2012-05-17 VITALS — BP 104/68 | HR 80 | Temp 98.3°F | Ht 65.0 in | Wt 180.5 lb

## 2012-05-17 DIAGNOSIS — D649 Anemia, unspecified: Secondary | ICD-10-CM

## 2012-05-17 DIAGNOSIS — Z1231 Encounter for screening mammogram for malignant neoplasm of breast: Secondary | ICD-10-CM

## 2012-05-17 DIAGNOSIS — Z800 Family history of malignant neoplasm of digestive organs: Secondary | ICD-10-CM

## 2012-05-17 DIAGNOSIS — Z0000 Encounter for general adult medical examination without abnormal findings: Secondary | ICD-10-CM

## 2012-05-17 DIAGNOSIS — Z01419 Encounter for gynecological examination (general) (routine) without abnormal findings: Secondary | ICD-10-CM

## 2012-05-17 DIAGNOSIS — Z1151 Encounter for screening for human papillomavirus (HPV): Secondary | ICD-10-CM | POA: Insufficient documentation

## 2012-05-17 MED ORDER — LEVONORGESTREL-ETHINYL ESTRAD 0.1-20 MG-MCG PO TABS: 1.0000 | ORAL_TABLET | Freq: Every day | ORAL | Status: DC

## 2012-05-17 NOTE — Progress Notes (Signed)
Subjective:    Patient ID: Katie Espinoza, female    DOB: 1966/04/06, 46 y.o.   MRN: [id number]  HPI Here for health maintenance exam and to review chronic medical problems    Is doing ok overall   Wt is stable with bmi of 30  Flu vaccine- had it yesterday   Pap 10/12 Periods are heavy in general Periods last about 7-8 days , heavy for 4 days , a lot of cramping - with age  Years ago was on OC to regulate period  Not smoking now  Hx of fibroid  She has discomfort with sexual activity only if husband ejeculates   mammo 7/11 Needs a mammogram - [facility]  Self exam  Lab Results  Component Value Date   CHOL 158 05/07/2012   HDL 40.40 05/07/2012   LDLCALC 102* 05/07/2012   TRIG 79.0 05/07/2012   CHOLHDL 4 05/07/2012    Anemia  Lab Results  Component Value Date   WBC 5.9 05/07/2012   HGB 9.8* 05/07/2012   HCT 31.6* 05/07/2012   MCV 77.4* 05/07/2012   PLT 286.0 05/07/2012   stopped taking the nu iron due to bad constipation Is tired   Has fam hx of colon cancer  Dr Juanda Chance says she does not want another colonosc for 4 more years from now   Patient Active Problem List  Diagnosis  . MIGRAINE HEADACHE  . RHINITIS, ALLERGIC NEC  . ACTINIC KERATOSIS  . Routine general medical examination at a health care facility  . Anemia  . Family history of colon cancer  . Routine gynecological examination  . Other screening mammogram   Past Medical History  Diagnosis Date  . Uterine fibroid    Past Surgical History  Procedure Date  . Orif toe fracture    History  Substance Use Topics  . Smoking status: Former Smoker    Types: Cigarettes    Quit date: 07/31/1990  . Smokeless tobacco: Never Used  . Alcohol Use: Yes     occasional   Family History  Problem Relation Age of Onset  . Cancer Father     colon  . Hyperlipidemia Mother    Allergies  Allergen Reactions  . Amoxicillin     REACTION: reaction not known  . Erythromycin     REACTION: reaction not known  .  Penicillins     REACTION: reaction not known   Current Outpatient Prescriptions on File Prior to Visit  Medication Sig Dispense Refill  . famotidine (PEPCID) 20 MG tablet Take 20 mg by mouth as needed.      . fexofenadine (ALLEGRA) 60 MG tablet Take 60 mg by mouth daily as needed.        Marland Kitchen ibuprofen (ADVIL,MOTRIN) 200 MG tablet Take 200 mg by mouth every 6 (six) hours as needed.        . isometheptene-acetaminophen-dichloralphenazone (MIDRIN) 65-325-100 MG capsule Take 1-2 capsules by mouth 4 (four) times daily as needed.  15 capsule  1  . levonorgestrel-ethinyl estradiol (LESSINA-28) 0.1-20 MG-MCG tablet Take 1 tablet by mouth daily.  1 Package  11       Review of Systems Review of Systems  Constitutional: Negative for fever, appetite change,  and unexpected weight change. pos for fatigue Eyes: Negative for pain and visual disturbance.  Respiratory: Negative for cough and shortness of breath.   Cardiovascular: Negative for cp or palpitations    Gastrointestinal: Negative for nausea, diarrhea and constipation.  Genitourinary: Negative for urgency and frequency.  Skin: Negative for pallor or rash   Neurological: Negative for weakness, light-headedness, numbness and headaches.  Hematological: Negative for adenopathy. Does not bruise/bleed easily.  Psychiatric/Behavioral: Negative for dysphoric mood. The patient is not nervous/anxious.         Objective:   Physical Exam  Constitutional: She appears well-developed and well-nourished. No distress.  HENT:  Head: Normocephalic and atraumatic.  Right Ear: External ear normal.  Left Ear: External ear normal.  Nose: Nose normal.  Mouth/Throat: Oropharynx is clear and moist.  Eyes: Conjunctivae normal and EOM are normal. Pupils are equal, round, and reactive to light. Right eye exhibits no discharge. Left eye exhibits no discharge. No scleral icterus.  Neck: Normal range of motion. Neck supple. No JVD present. Carotid bruit is not  present. No thyromegaly present.  Cardiovascular: Normal rate, regular rhythm, normal heart sounds and intact distal pulses.  Exam reveals no gallop.   Pulmonary/Chest: Effort normal and breath sounds normal. No respiratory distress. She has no wheezes.  Abdominal: Soft. Bowel sounds are normal. She exhibits no distension, no abdominal bruit and no mass. There is no tenderness.  Genitourinary: Vagina normal. No breast swelling, tenderness, discharge or bleeding. There is no rash or tenderness on the right labia. There is no rash or tenderness on the left labia. Uterus is enlarged. Uterus is not tender. Cervix exhibits no motion tenderness and no friability. Right adnexum displays no mass, no tenderness and no fullness. Left adnexum displays no mass, no tenderness and no fullness. No bleeding around the vagina. No vaginal discharge found.       Uterine fibroid noted on L - baseline  Breast exam: No mass, nodules, thickening, tenderness, bulging, retraction, inflamation, nipple discharge or skin changes noted.  No axillary or clavicular LA.  Chaperoned exam.    Musculoskeletal: She exhibits no edema and no tenderness.  Lymphadenopathy:    She has no cervical adenopathy.  Neurological: She is alert. She has normal reflexes. No cranial nerve deficit. She exhibits normal muscle tone. Coordination normal.  Skin: Skin is warm and dry. No rash noted. No erythema. No pallor.  Psychiatric: She has a normal mood and affect.          Assessment & Plan:

## 2012-05-17 NOTE — Patient Instructions (Addendum)
Start the pill the first Sunday after your period starts (or if period starts on Sunday start it that day) Take it at the same time every day If any problems or problems let me know  Give about 3 months to help periods  Follow up with me in 3-4 months with with labs prior for anemia Eat iron rich foods Stop up front for your mammogram referral  Take good care of yourself

## 2012-05-22 ENCOUNTER — Encounter: Payer: Self-pay | Admitting: *Deleted

## 2012-06-17 ENCOUNTER — Ambulatory Visit
Admission: RE | Admit: 2012-06-17 | Discharge: 2012-06-17 | Disposition: A | Discharge status: Home / Self Care | Payer: 59 | Source: Ambulatory Visit | Attending: Family Medicine | Admitting: Family Medicine

## 2012-06-17 DIAGNOSIS — Z1231 Encounter for screening mammogram for malignant neoplasm of breast: Secondary | ICD-10-CM

## 2012-06-21 ENCOUNTER — Other Ambulatory Visit: Payer: Self-pay | Admitting: Family Medicine

## 2012-06-21 DIAGNOSIS — R928 Other abnormal and inconclusive findings on diagnostic imaging of breast: Secondary | ICD-10-CM

## 2012-07-02 ENCOUNTER — Encounter: Payer: Self-pay | Admitting: Family Medicine

## 2012-07-02 ENCOUNTER — Ambulatory Visit (INDEPENDENT_AMBULATORY_CARE_PROVIDER_SITE_OTHER): Payer: 59 | Admitting: Family Medicine

## 2012-07-02 ENCOUNTER — Ambulatory Visit
Admission: RE | Admit: 2012-07-02 | Discharge: 2012-07-02 | Disposition: A | Discharge status: Home / Self Care | Payer: 59 | Source: Ambulatory Visit | Attending: Family Medicine | Admitting: Family Medicine

## 2012-07-02 ENCOUNTER — Telehealth: Payer: Self-pay | Admitting: Family Medicine

## 2012-07-02 VITALS — BP 100/68 | HR 75 | Temp 98.6°F | Ht 65.0 in | Wt 186.8 lb

## 2012-07-02 DIAGNOSIS — R928 Other abnormal and inconclusive findings on diagnostic imaging of breast: Secondary | ICD-10-CM

## 2012-07-02 DIAGNOSIS — J029 Acute pharyngitis, unspecified: Secondary | ICD-10-CM

## 2012-07-02 DIAGNOSIS — K120 Recurrent oral aphthae: Secondary | ICD-10-CM

## 2012-07-02 NOTE — Progress Notes (Signed)
  Subjective:    Patient ID: Katie Espinoza, female    DOB: June 28, 1966, 46 y.o.   MRN: [id number]  Sore Throat  This is a new problem. The current episode started in the past 7 days (3 days ago). The problem has been gradually worsening. Maximum temperature: subjective. The fever has been present for 1 to 2 days. The pain is moderate. Associated symptoms include ear pain and trouble swallowing. Pertinent negatives include no abdominal pain, coughing, neck pain, shortness of breath or vomiting. Associated symptoms comments: Noted right sore throat.. Noted ulcer. She has had exposure to mono. She has had no exposure to strep. Exposure to: daugheter room mate with mono. She has tried NSAIDs for the symptoms. The treatment provided moderate relief.  Otalgia  There is pain in the right ear. This is a new problem. The current episode started in the past 7 days. Pertinent negatives include no abdominal pain, coughing, drainage, neck pain, rhinorrhea or vomiting. There is no history of a chronic ear infection, hearing loss or a tympanostomy tube.      Review of Systems  Constitutional: Negative for fever and fatigue.  HENT: Positive for ear pain and trouble swallowing. Negative for rhinorrhea and neck pain.   Eyes: Negative for pain.  Respiratory: Negative for cough, chest tightness and shortness of breath.   Cardiovascular: Negative for chest pain, palpitations and leg swelling.  Gastrointestinal: Negative for vomiting and abdominal pain.  Genitourinary: Negative for dysuria.       Objective:   Physical Exam  Constitutional: Vital signs are normal. She appears well-developed and well-nourished. She is cooperative.  Non-toxic appearance. She does not appear ill. No distress.  HENT:  Head: Normocephalic.  Right Ear: Hearing, tympanic membrane, external ear and ear canal normal. Tympanic membrane is not erythematous, not retracted and not bulging.  Left Ear: Hearing, tympanic membrane, external ear  and ear canal normal. Tympanic membrane is not erythematous, not retracted and not bulging.  Nose: Mucosal edema and rhinorrhea present. Right sinus exhibits no maxillary sinus tenderness and no frontal sinus tenderness. Left sinus exhibits no maxillary sinus tenderness and no frontal sinus tenderness.  Mouth/Throat: Uvula is midline, oropharynx is clear and moist and mucous membranes are normal. No oropharyngeal exudate, posterior oropharyngeal edema, posterior oropharyngeal erythema or tonsillar abscesses.         White based ulcer right upper oropharynx  Eyes: Conjunctivae normal, EOM and lids are normal. Pupils are equal, round, and reactive to light. No foreign bodies found.  Neck: Trachea normal and normal range of motion. Neck supple. Carotid bruit is not present. No mass and no thyromegaly present.  Cardiovascular: Normal rate, regular rhythm, S1 normal, S2 normal, normal heart sounds, intact distal pulses and normal pulses.  Exam reveals no gallop and no friction rub.   No murmur heard. Pulmonary/Chest: Effort normal and breath sounds normal. Not tachypneic. No respiratory distress. She has no decreased breath sounds. She has no wheezes. She has no rhonchi. She has no rales.  Neurological: She is alert.  Skin: Skin is warm, dry and intact. No rash noted.  Psychiatric: Her speech is normal and behavior is normal. Judgment normal. Her mood appears not anxious. Cognition and memory are normal. She does not exhibit a depressed mood.          Assessment & Plan:

## 2012-07-02 NOTE — Telephone Encounter (Signed)
Patient Information:  Caller Name: Leyna  Phone: [phone]  Patient: Katie Espinoza, Katie Espinoza  Gender: Female  DOB: Jul 04, 1966  Age: 46 Years  PCP: [facility] [employer] [facility])  Pregnant: No   Symptoms  Reason For Call & Symptoms: right sided sore throat, earache  Reviewed Health History In EMR: Yes  Reviewed Medications In EMR: Yes  Reviewed Allergies In EMR: Yes  Reviewed Surgeries / Procedures: Yes  Date of Onset of Symptoms: 06/29/2012 OB:  LMP: Unknown  Guideline(s) Used:  Sore Throat  Disposition Per Guideline:   See Today in Office  Reason For Disposition Reached:   Pus on tonsils (back of throat) and swollen neck lymph nodes ("glands")  Advice Given:  N/A  Office Follow Up:  Does the office need to follow up with this patient?: No  Instructions For The Office: N/A  Appointment Scheduled:  07/02/2012 15:15:00 Appointment Scheduled Provider:  Kerby Nora (Family Practice)  RN Note:  Notes right sided blister in throat and onset of right ear ache.  Appt scheduled per protocol for 07/02/12 1515 with Dr. Ermalene Searing. krs/can

## 2012-07-02 NOTE — Assessment & Plan Note (Signed)
In a location where referred pain to ear is likely. No ear pathology. Symptomatic care reviewed.

## 2012-07-03 ENCOUNTER — Encounter: Payer: Self-pay | Admitting: *Deleted

## 2012-09-09 ENCOUNTER — Telehealth: Payer: Self-pay | Admitting: Family Medicine

## 2012-09-09 DIAGNOSIS — D649 Anemia, unspecified: Secondary | ICD-10-CM

## 2012-09-09 NOTE — Telephone Encounter (Signed)
Message copied by [name] on Mon Sep 09, 2012 10:46 PM ------      Message from: Alvina Chou      Created: Mon Sep 02, 2012  3:23 PM      Regarding: Lab orders for Tuesday, 2.11.14       Lab orders for a 3 month f/u ------

## 2012-09-10 ENCOUNTER — Other Ambulatory Visit (INDEPENDENT_AMBULATORY_CARE_PROVIDER_SITE_OTHER): Payer: 59

## 2012-09-10 DIAGNOSIS — D649 Anemia, unspecified: Secondary | ICD-10-CM

## 2012-09-10 LAB — CBC WITH DIFFERENTIAL/PLATELET
Basophils Absolute: 0 10*3/uL (ref 0.0–0.1)
Eosinophils Relative: 2.4 % (ref 0.0–5.0)
HCT: 29.2 % — ABNORMAL LOW (ref 36.0–46.0)
Lymphs Abs: 1.7 10*3/uL (ref 0.7–4.0)
MCV: 70.4 fl — ABNORMAL LOW (ref 78.0–100.0)
Monocytes Absolute: 0.6 10*3/uL (ref 0.1–1.0)
Monocytes Relative: 7.7 % (ref 3.0–12.0)
Neutrophils Relative %: 67.9 % (ref 43.0–77.0)
Platelets: 329 10*3/uL (ref 150.0–400.0)
RDW: 17.1 % — ABNORMAL HIGH (ref 11.5–14.6)
WBC: 7.9 10*3/uL (ref 4.5–10.5)

## 2012-09-10 LAB — IBC PANEL: Iron: 21 ug/dL — ABNORMAL LOW (ref 42–145)

## 2012-09-10 LAB — FERRITIN: Ferritin: 2.4 ng/mL — ABNORMAL LOW (ref 10.0–291.0)

## 2012-09-17 ENCOUNTER — Ambulatory Visit (INDEPENDENT_AMBULATORY_CARE_PROVIDER_SITE_OTHER): Payer: 59 | Admitting: Family Medicine

## 2012-09-17 ENCOUNTER — Encounter: Payer: Self-pay | Admitting: Family Medicine

## 2012-09-17 VITALS — BP 110/70 | HR 82 | Temp 98.7°F | Ht 65.0 in | Wt 189.2 lb

## 2012-09-17 DIAGNOSIS — D509 Iron deficiency anemia, unspecified: Secondary | ICD-10-CM

## 2012-09-17 DIAGNOSIS — N920 Excessive and frequent menstruation with regular cycle: Secondary | ICD-10-CM | POA: Insufficient documentation

## 2012-09-17 NOTE — Progress Notes (Signed)
Subjective:    Patient ID: Katie Espinoza, female    DOB: 03/28/1966, 47 y.o.   MRN: [id number]  HPI Here for f/u of anemia and heavy menses  Last visit put her on OC- lessina peroids are a lot lighter Only last 4 days  Still a lot of cramping  No side effects - except increase in hunger  No breakthrough bleeding   No iron at all at this time   Lab Results  Component Value Date   WBC 7.9 09/10/2012   HGB 9.4* 09/10/2012   HCT 29.2* 09/10/2012   MCV 70.4* 09/10/2012   PLT 329.0 09/10/2012     Iron 21 Lab Results  Component Value Date   FERRITIN 2.4* 09/10/2012    Stable vitals   Does feel better in general - and little more energy   No family hx of anemia   No abd pain  No blood in stool  Next colonoscopy is not due for a while   Patient Active Problem List  Diagnosis  . MIGRAINE HEADACHE  . RHINITIS, ALLERGIC NEC  . ACTINIC KERATOSIS  . Routine general medical examination at a health care facility  . Anemia  . Family history of colon cancer  . Routine gynecological examination  . Other screening mammogram  . Oral aphthous ulcer   Past Medical History  Diagnosis Date  . Uterine fibroid    Past Surgical History  Procedure Laterality Date  . Orif toe fracture     History  Substance Use Topics  . Smoking status: Former Smoker    Types: Cigarettes    Quit date: 07/31/1990  . Smokeless tobacco: Never Used  . Alcohol Use: Yes     Comment: occasional   Family History  Problem Relation Age of Onset  . Cancer Father     colon  . Hyperlipidemia Mother    Allergies  Allergen Reactions  . Amoxicillin     REACTION: reaction not known  . Erythromycin     REACTION: reaction not known  . Penicillins     REACTION: reaction not known   Current Outpatient Prescriptions on File Prior to Visit  Medication Sig Dispense Refill  . famotidine (PEPCID) 20 MG tablet Take 20 mg by mouth as needed.      . fexofenadine (ALLEGRA) 60 MG tablet Take 60 mg by mouth  daily as needed.        Marland Kitchen ibuprofen (ADVIL,MOTRIN) 200 MG tablet Take 200 mg by mouth every 6 (six) hours as needed.        Marland Kitchen levonorgestrel-ethinyl estradiol (LESSINA-28) 0.1-20 MG-MCG tablet Take 1 tablet by mouth daily.  1 Package  11  . isometheptene-acetaminophen-dichloralphenazone (MIDRIN) 65-325-100 MG capsule Take 1-2 capsules by mouth 4 (four) times daily as needed.  15 capsule  1   No current facility-administered medications on file prior to visit.     Review of Systems Review of Systems  Constitutional: Negative for fever, appetite change, and unexpected weight change.  Eyes: Negative for pain and visual disturbance.  Respiratory: Negative for cough and shortness of breath.   Cardiovascular: Negative for cp or palpitations    Gastrointestinal: Negative for nausea, diarrhea and constipation.  Genitourinary: Negative for urgency and frequency.  Skin: Negative for pallor or rash   Neurological: Negative for weakness, light-headedness, numbness and headaches.  Hematological: Negative for adenopathy. Does not bruise/bleed easily.  Psychiatric/Behavioral: Negative for dysphoric mood. The patient is not nervous/anxious.         Objective:  Physical Exam  Constitutional: She appears well-developed and well-nourished. No distress.  HENT:  Head: Normocephalic and atraumatic.  Mouth/Throat: Oropharynx is clear and moist.  Eyes: EOM are normal. Pupils are equal, round, and reactive to light. No scleral icterus.  Mild conj pallor   Neck: Normal range of motion. Neck supple.  Cardiovascular: Normal rate, regular rhythm, normal heart sounds and intact distal pulses.  Exam reveals no gallop.   No murmur heard. Pulmonary/Chest: Effort normal and breath sounds normal. No respiratory distress. She has no wheezes. She has no rales.  Abdominal: Soft. Bowel sounds are normal. She exhibits no distension and no mass. There is no tenderness.  No suprapubic tenderness or fullness     Lymphadenopathy:    She has no cervical adenopathy.  Neurological: She is alert.  Skin: Skin is warm and dry. No rash noted. No erythema. No pallor.  Psychiatric: She has a normal mood and affect.          Assessment & Plan:

## 2012-09-17 NOTE — Assessment & Plan Note (Signed)
Much improved with lessina Will continue this and doing well

## 2012-09-17 NOTE — Assessment & Plan Note (Addendum)
This has not responded to slow down of menses - and pt cannot take oral iron of any kind due to GI side eff Her colon cancer screen is up to date Ferritin is very low Will refer to hematology- ? Candidate for IV iron

## 2012-09-17 NOTE — Patient Instructions (Addendum)
Keep eating iron rich foods - as much as you can  Continue the oral contraceptive daily We will do referral to hematology at check out

## 2012-09-27 ENCOUNTER — Ambulatory Visit: Payer: Self-pay | Admitting: Internal Medicine

## 2012-10-03 ENCOUNTER — Ambulatory Visit: Payer: Self-pay | Admitting: Internal Medicine

## 2012-10-11 LAB — CANCER CENTER HEMOGLOBIN: HGB: 9.6 g/dL — ABNORMAL LOW (ref 12.0–16.0)

## 2012-10-29 ENCOUNTER — Ambulatory Visit: Payer: Self-pay | Admitting: Internal Medicine

## 2012-11-28 ENCOUNTER — Ambulatory Visit: Payer: Self-pay | Admitting: Internal Medicine

## 2012-12-29 ENCOUNTER — Ambulatory Visit: Payer: Self-pay | Admitting: Internal Medicine

## 2013-01-28 ENCOUNTER — Ambulatory Visit: Payer: Self-pay | Admitting: Internal Medicine

## 2013-03-20 ENCOUNTER — Ambulatory Visit: Payer: Self-pay | Admitting: Internal Medicine

## 2013-07-13 ENCOUNTER — Telehealth: Payer: Self-pay | Admitting: Family Medicine

## 2013-07-13 DIAGNOSIS — Z0000 Encounter for general adult medical examination without abnormal findings: Secondary | ICD-10-CM

## 2013-07-13 NOTE — Telephone Encounter (Signed)
Message copied by [name] on Sun Jul 13, 2013  1:25 PM ------      Message from: Alvina Chou      Created: Fri Jul 04, 2013  2:49 PM      Regarding: Lab orders for Monday,12.15.14       Patient is scheduled for CPX labs, please order future labs, Thanks , Katie Espinoza       ------

## 2013-07-14 ENCOUNTER — Other Ambulatory Visit (INDEPENDENT_AMBULATORY_CARE_PROVIDER_SITE_OTHER): Payer: 59

## 2013-07-14 DIAGNOSIS — Z0000 Encounter for general adult medical examination without abnormal findings: Secondary | ICD-10-CM

## 2013-07-14 LAB — CBC WITH DIFFERENTIAL/PLATELET
Basophils Absolute: 0 10*3/uL (ref 0.0–0.1)
Basophils Relative: 0.4 % (ref 0.0–3.0)
Eosinophils Absolute: 0.2 10*3/uL (ref 0.0–0.7)
HCT: 31 % — ABNORMAL LOW (ref 36.0–46.0)
Lymphs Abs: 1.8 10*3/uL (ref 0.7–4.0)
MCHC: 32 g/dL (ref 30.0–36.0)
MCV: 69.6 fl — ABNORMAL LOW (ref 78.0–100.0)
Monocytes Absolute: 0.5 10*3/uL (ref 0.1–1.0)
Monocytes Relative: 7 % (ref 3.0–12.0)
Neutro Abs: 5.2 10*3/uL (ref 1.4–7.7)
Platelets: 311 10*3/uL (ref 150.0–400.0)
RDW: 17.4 % — ABNORMAL HIGH (ref 11.5–14.6)
WBC: 7.7 10*3/uL (ref 4.5–10.5)

## 2013-07-14 LAB — LIPID PANEL
HDL: 52 mg/dL (ref >39.00)
VLDL: 51.4 mg/dL — ABNORMAL HIGH (ref 0.0–40.0)

## 2013-07-14 LAB — COMPREHENSIVE METABOLIC PANEL
ALT: 13 U/L (ref 0–35)
AST: 19 U/L (ref 0–37)
BUN: 11 mg/dL (ref 6–23)
CO2: 26 mEq/L (ref 19–32)
Chloride: 106 mEq/L (ref 96–112)
Creatinine, Ser: 0.9 mg/dL (ref 0.4–1.2)
GFR: 68.02 mL/min (ref >60.00)
Glucose, Bld: 90 mg/dL (ref 70–99)
Total Bilirubin: 0.4 mg/dL (ref 0.3–1.2)

## 2013-07-14 LAB — LDL CHOLESTEROL, DIRECT: Direct LDL: 107.7 mg/dL

## 2013-07-23 ENCOUNTER — Encounter: Payer: Self-pay | Admitting: Family Medicine

## 2013-07-23 ENCOUNTER — Ambulatory Visit (INDEPENDENT_AMBULATORY_CARE_PROVIDER_SITE_OTHER): Payer: 59 | Admitting: Family Medicine

## 2013-07-23 VITALS — BP 126/64 | HR 72 | Temp 98.4°F | Ht 65.0 in | Wt 193.5 lb

## 2013-07-23 DIAGNOSIS — D509 Iron deficiency anemia, unspecified: Secondary | ICD-10-CM

## 2013-07-23 DIAGNOSIS — Z800 Family history of malignant neoplasm of digestive organs: Secondary | ICD-10-CM

## 2013-07-23 DIAGNOSIS — N920 Excessive and frequent menstruation with regular cycle: Secondary | ICD-10-CM

## 2013-07-23 DIAGNOSIS — Z0000 Encounter for general adult medical examination without abnormal findings: Secondary | ICD-10-CM

## 2013-07-23 MED ORDER — ISOMETHEPTENE-APAP-DICHLORAL 65-325-100 MG PO CAPS: 1.0000 | ORAL_CAPSULE | Freq: Four times a day (QID) | ORAL | Status: DC | PRN

## 2013-07-23 NOTE — Progress Notes (Signed)
Pre-visit discussion using our clinic review tool. No additional management support is needed unless otherwise documented below in the visit note.  

## 2013-07-23 NOTE — Patient Instructions (Signed)
Anemia is stable  We will refer you to GYN at check out  Keep eating high iron foods  Work on diet and exercise  when you can

## 2013-07-23 NOTE — Progress Notes (Signed)
Subjective:    Patient ID: Katie Espinoza, female    DOB: 05/24/1966, 47 y.o.   MRN: [id number]  HPI Here for health maintenance exam and to review chronic medical problems    Doing well overall   She stopped taking OC back in sept -did not notice any changes (it did not change her peroids)  Was regular with peroids  This month has been spotting - pretty close to 2 weeks  Also had an odor to it that was unusual   Usually period lasts 7 days with 4 d of heavy flow and bad cramping  She does have a fibroid  Last [place] was a while     Lab Results  Component Value Date   WBC 7.7 07/14/2013   HGB 9.9* 07/14/2013   HCT 31.0* 07/14/2013   MCV 69.6 Repeated and verified X2.* 07/14/2013   PLT 311.0 07/14/2013  she went to Dr Neale Burly at [facility]- and took iron IV and that did not seem to help  Could not afford to do another tx   She is tired, however    Wt is up 3-4 lbs  bmi of 32 Not eating a healthy diet  Not much activity due to fatigue   Mammogram 12/13 - due this mo -has it set up already No breast lumps   Pap 10/13 neg Has a fibroid Td 6/11   Colonoscopy not due until 2019 (fam hx )   Mood is ok     Chemistry      Component Value Date/Time   NA 139 07/14/2013 0846   K 4.4 07/14/2013 0846   CL 106 07/14/2013 0846   CO2 26 07/14/2013 0846   BUN 11 07/14/2013 0846   CREATININE 0.9 07/14/2013 0846      Component Value Date/Time   CALCIUM 9.0 07/14/2013 0846   ALKPHOS 54 07/14/2013 0846   AST 19 07/14/2013 0846   ALT 13 07/14/2013 0846   BILITOT 0.4 07/14/2013 0846     glucose 90 Lab Results  Component Value Date   TSH 1.83 07/14/2013    Lab Results  Component Value Date   CHOL 188 07/14/2013   CHOL 158 05/07/2012   CHOL 163 05/02/2011   Lab Results  Component Value Date   HDL 52.00 07/14/2013   HDL 40.40 05/07/2012   HDL 40.98 05/02/2011   Lab Results  Component Value Date   LDLCALC 102* 05/07/2012   LDLCALC 93 05/02/2011   LDLCALC 109* 01/24/2010    Lab Results  Component Value Date   TRIG 257.0* 07/14/2013   TRIG 79.0 05/07/2012   TRIG 114.0 05/02/2011   Lab Results  Component Value Date   CHOLHDL 4 07/14/2013   CHOLHDL 4 05/07/2012   CHOLHDL 3 05/02/2011   Lab Results  Component Value Date   LDLDIRECT 107.7 07/14/2013   trig are up - not sure why- did eat BBQ the day before      Patient Active Problem List   Diagnosis Date Noted  . Heavy menses 09/17/2012  . Oral aphthous ulcer 07/02/2012  . Other screening mammogram 05/17/2012  . Anemia, iron deficiency 05/08/2011  . Family history of colon cancer 05/08/2011  . Routine gynecological examination 05/08/2011  . Routine general medical examination at a health care facility 05/01/2011  . 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 MIGRAINE HEADACHE 02/14/2007  . RHINITIS, ALLERGIC NEC 02/14/2007   Past Medical History  Diagnosis Date  . Uterine fibroid    Past Surgical History  Procedure Laterality Date  . Orif toe fracture     History  Substance Use Topics  . Smoking status: Former Smoker    Types: Cigarettes    Quit date: 07/31/1990  . Smokeless tobacco: Never Used  . Alcohol Use: Yes     Comment: occasional   Family History  Problem Relation Age of Onset  . Cancer Father     colon  . Hyperlipidemia Mother    Allergies  Allergen Reactions  . Amoxicillin     REACTION: reaction not known  . Erythromycin     REACTION: reaction not known  . Penicillins     REACTION: reaction not known   Current Outpatient Prescriptions on File Prior to Visit  Medication Sig Dispense Refill  . famotidine (PEPCID) 20 MG tablet Take 20 mg by mouth as needed.      . fexofenadine (ALLEGRA) 60 MG tablet Take 60 mg by mouth daily as needed.        Marland Kitchen ibuprofen (ADVIL,MOTRIN) 200 MG tablet Take 200 mg by mouth every 6 (six) hours as needed.        . isometheptene-acetaminophen-dichloralphenazone (MIDRIN) 65-325-100 MG capsule Take 1-2 capsules by mouth 4 (four) times daily as needed.   15 capsule  1   No current facility-administered medications on file prior to visit.    Review of Systems Review of Systems  Constitutional: Negative for fever, appetite change,  and unexpected weight change. pos for fatigue  Eyes: Negative for pain and visual disturbance.  Respiratory: Negative for cough and shortness of breath.   Cardiovascular: Negative for cp or palpitations    Gastrointestinal: Negative for nausea, diarrhea and constipation.  Genitourinary: Negative for urgency and frequency pos for irregular menses/ also heavy/ neg for pelvic pain .  Skin: Negative for pallor or rash   Neurological: Negative for weakness, light-headedness, numbness and headaches.  Hematological: Negative for adenopathy. Does not bruise/bleed easily.  Psychiatric/Behavioral: Negative for dysphoric mood. The patient is not nervous/anxious.         Objective:   Physical Exam  Constitutional: She appears well-developed and well-nourished. No distress.  obese and well appearing   HENT:  Head: Normocephalic and atraumatic.  Right Ear: External ear normal.  Left Ear: External ear normal.  Nose: Nose normal.  Mouth/Throat: Oropharynx is clear and moist.  Eyes: Conjunctivae and EOM are normal. Pupils are equal, round, and reactive to light. Right eye exhibits no discharge. Left eye exhibits no discharge. No scleral icterus.  Neck: Normal range of motion. Neck supple. No JVD present. No thyromegaly present.  Cardiovascular: Normal rate, regular rhythm, normal heart sounds and intact distal pulses.  Exam reveals no gallop.   Pulmonary/Chest: Effort normal and breath sounds normal. No respiratory distress. She has no wheezes. She has no rales.  Abdominal: Soft. Bowel sounds are normal. She exhibits no distension and no mass. There is no tenderness.  Musculoskeletal: She exhibits no edema and no tenderness.  Lymphadenopathy:    She has no cervical adenopathy.  Neurological: She is alert. She has normal  reflexes. No cranial nerve deficit. She exhibits normal muscle tone. Coordination normal.  Skin: Skin is warm and dry. No rash noted. No erythema. No pallor.  Psychiatric: She has a normal mood and affect.          Assessment & Plan:

## 2013-07-24 NOTE — Assessment & Plan Note (Signed)
With ongoing iron def anemia an hx of a fibroid tumor  Ref to gyn for eval and also annual pap/exam

## 2013-07-24 NOTE — Assessment & Plan Note (Signed)
utd colonoscopy 

## 2013-07-24 NOTE — Assessment & Plan Note (Signed)
Pt had one IV iron infusion-cannot afford another Will return to hematology when able Enc high iron diet  Will ref to gyn for heavy menses as well

## 2013-07-24 NOTE — Assessment & Plan Note (Signed)
Reviewed health habits including diet and exercise and skin cancer prevention Reviewed appropriate screening tests for age  Also reviewed health mt list, fam hx and immunization status , as well as social and family history   Labs reviewed  

## 2013-09-09 ENCOUNTER — Other Ambulatory Visit: Payer: Self-pay

## 2013-09-09 DIAGNOSIS — Z1231 Encounter for screening mammogram for malignant neoplasm of breast: Secondary | ICD-10-CM

## 2013-09-25 ENCOUNTER — Encounter (HOSPITAL_COMMUNITY): Payer: Self-pay

## 2013-09-26 ENCOUNTER — Ambulatory Visit
Admission: RE | Admit: 2013-09-26 | Discharge: 2013-09-26 | Disposition: A | Discharge status: Home / Self Care | Payer: Self-pay | Source: Ambulatory Visit

## 2013-09-26 DIAGNOSIS — Z1231 Encounter for screening mammogram for malignant neoplasm of breast: Secondary | ICD-10-CM

## 2013-09-30 ENCOUNTER — Encounter: Payer: Self-pay | Admitting: *Deleted

## 2013-10-10 ENCOUNTER — Encounter (HOSPITAL_COMMUNITY): Payer: Self-pay | Admitting: Pharmacist

## 2013-10-21 NOTE — H&P (Signed)
Katie Espinoza is an 48 y.o. female with menometrorrhagia which has failed OCPs.  On sonohysterogram, there is a 2.7 cm intracavitary polypoid mass.  She declines hysterectomy.    Pertinent Gynecological History: Menses: flow is excessive with use of 8 pads or tampons on heaviest days Bleeding: dysfunctional uterine bleeding Contraception: vasectomy DES exposure: unknown Blood transfusions: none Sexually transmitted diseases: no past history Previous GYN Procedures: none  Last mammogram: normal Date: 2013 Last pap: normal Date: 2013 OB History: G2, P2   Menstrual History: Menarche age: n/a Patient's last menstrual period was 07/08/2013.    Past Medical History  Diagnosis Date  . Uterine fibroid   . Dysrhythmia     increased heart rate at times  . Anemia     iron infusions in the past  . Asthma     childhood  . GERD (gastroesophageal reflux disease)   . Headache(784.0)     migraines    Past Surgical History  Procedure Laterality Date  . Orif toe fracture      Family History  Problem Relation Age of Onset  . Cancer Father     colon  . Hyperlipidemia Mother     Social History:  reports that she quit smoking about 23 years ago. Her smoking use included Cigarettes. She smoked 0.00 packs per day. She has never used smokeless tobacco. She reports that she drinks alcohol. She reports that she does not use illicit drugs.  Allergies:  Allergies  Allergen Reactions  . Amoxicillin Rash  . Erythromycin Rash  . Penicillins Rash    No prescriptions prior to admission    ROS  Last menstrual period 07/08/2013. Physical Exam  Constitutional: She is oriented to person, place, and time. She appears well-developed and well-nourished.  GI: Soft. There is no rebound and no guarding.  Neurological: She is alert and oriented to person, place, and time.  Skin: Skin is warm and dry.  Psychiatric: She has a normal mood and affect. Her behavior is normal.    No results found  for this or any previous visit (from the past 24 hour(s)).  No results found.  Assessment/Plan: 48 yo with menometrorrhagia and intracavitary mass -H/S, D&C, Truclear  Abdulraheem Pineo 10/21/2013, 8:41 PM

## 2013-10-22 ENCOUNTER — Ambulatory Visit (HOSPITAL_COMMUNITY)
Admission: RE | Admit: 2013-10-22 | Discharge: 2013-10-22 | Disposition: A | Discharge status: Home / Self Care | Payer: 59 | Source: Ambulatory Visit | Attending: Obstetrics & Gynecology | Admitting: Obstetrics & Gynecology

## 2013-10-22 ENCOUNTER — Encounter (HOSPITAL_COMMUNITY)
Admission: RE | Disposition: A | Discharge status: Home / Self Care | Payer: Self-pay | Source: Ambulatory Visit | Attending: Obstetrics & Gynecology

## 2013-10-22 ENCOUNTER — Encounter (HOSPITAL_COMMUNITY): Payer: Self-pay | Admitting: *Deleted

## 2013-10-22 ENCOUNTER — Ambulatory Visit (HOSPITAL_COMMUNITY): Payer: 59 | Admitting: Anesthesiology

## 2013-10-22 ENCOUNTER — Encounter (HOSPITAL_COMMUNITY): Payer: 59 | Admitting: Anesthesiology

## 2013-10-22 DIAGNOSIS — Z87891 Personal history of nicotine dependence: Secondary | ICD-10-CM | POA: Insufficient documentation

## 2013-10-22 DIAGNOSIS — N920 Excessive and frequent menstruation with regular cycle: Secondary | ICD-10-CM | POA: Insufficient documentation

## 2013-10-22 DIAGNOSIS — R9389 Abnormal findings on diagnostic imaging of other specified body structures: Secondary | ICD-10-CM | POA: Insufficient documentation

## 2013-10-22 DIAGNOSIS — I499 Cardiac arrhythmia, unspecified: Secondary | ICD-10-CM | POA: Insufficient documentation

## 2013-10-22 DIAGNOSIS — K219 Gastro-esophageal reflux disease without esophagitis: Secondary | ICD-10-CM | POA: Insufficient documentation

## 2013-10-22 DIAGNOSIS — D649 Anemia, unspecified: Secondary | ICD-10-CM | POA: Insufficient documentation

## 2013-10-22 HISTORY — PX: DILATATION & CURETTAGE/HYSTEROSCOPY WITH TRUECLEAR: SHX6353

## 2013-10-22 LAB — CBC
HEMATOCRIT: 27.5 % — AB (ref 36.0–46.0)
Hemoglobin: 8 g/dL — ABNORMAL LOW (ref 12.0–15.0)
MCH: 20.4 pg — AB (ref 26.0–34.0)
MCHC: 29.1 g/dL — ABNORMAL LOW (ref 30.0–36.0)
MCV: 70.2 fL — AB (ref 78.0–100.0)
Platelets: 388 10*3/uL (ref 150–400)
RBC: 3.92 MIL/uL (ref 3.87–5.11)
RDW: 17 % — ABNORMAL HIGH (ref 11.5–15.5)
WBC: 7.5 10*3/uL (ref 4.0–10.5)

## 2013-10-22 LAB — PREGNANCY, URINE: Preg Test, Ur: NEGATIVE

## 2013-10-22 SURGERY — DILATATION & CURETTAGE/HYSTEROSCOPY WITH TRUCLEAR
Anesthesia: General | Site: Vagina

## 2013-10-22 MED ORDER — SODIUM CHLORIDE 0.9 % IR SOLN
Status: DC | PRN
  Administered 2013-10-22: 6000 mL

## 2013-10-22 MED ORDER — MEPERIDINE HCL 25 MG/ML IJ SOLN: 6.2500 mg | INTRAMUSCULAR | Status: DC | PRN

## 2013-10-22 MED ORDER — FENTANYL CITRATE 0.05 MG/ML IJ SOLN
25.0000 ug | INTRAMUSCULAR | Status: DC | PRN
  Administered 2013-10-22: 50 ug via INTRAVENOUS

## 2013-10-22 MED ORDER — IBUPROFEN 800 MG PO TABS: 800.0000 mg | ORAL_TABLET | Freq: Four times a day (QID) | ORAL | Status: DC | PRN

## 2013-10-22 MED ORDER — DEXTROSE IN LACTATED RINGERS 5 % IV SOLN: INTRAVENOUS | Status: DC

## 2013-10-22 MED ORDER — KETOROLAC TROMETHAMINE 30 MG/ML IJ SOLN
INTRAMUSCULAR | Status: DC | PRN
  Administered 2013-10-22: 30 mg via INTRAVENOUS

## 2013-10-22 MED ORDER — ONDANSETRON HCL 4 MG/2ML IJ SOLN
INTRAMUSCULAR | Status: AC
  Filled 2013-10-22: qty 2

## 2013-10-22 MED ORDER — ONDANSETRON HCL 4 MG/2ML IJ SOLN
INTRAMUSCULAR | Status: DC | PRN
  Administered 2013-10-22: 4 mg via INTRAVENOUS

## 2013-10-22 MED ORDER — LIDOCAINE HCL (CARDIAC) 20 MG/ML IV SOLN
INTRAVENOUS | Status: DC | PRN
  Administered 2013-10-22: 60 mg via INTRAVENOUS

## 2013-10-22 MED ORDER — METOCLOPRAMIDE HCL 5 MG/ML IJ SOLN
10.0000 mg | Freq: Once | INTRAMUSCULAR | Status: DC | PRN
Start: 2013-10-22 — End: 2013-10-22

## 2013-10-22 MED ORDER — LIDOCAINE HCL (CARDIAC) 20 MG/ML IV SOLN
INTRAVENOUS | Status: AC
  Filled 2013-10-22: qty 5

## 2013-10-22 MED ORDER — FENTANYL CITRATE 0.05 MG/ML IJ SOLN
INTRAMUSCULAR | Status: DC | PRN
Start: 2013-10-22 — End: 2013-10-22
  Administered 2013-10-22: 50 ug via INTRAVENOUS
  Administered 2013-10-22: 100 ug via INTRAVENOUS

## 2013-10-22 MED ORDER — MIDAZOLAM HCL 2 MG/2ML IJ SOLN
INTRAMUSCULAR | Status: DC | PRN
  Administered 2013-10-22: 2 mg via INTRAVENOUS

## 2013-10-22 MED ORDER — MIDAZOLAM HCL 2 MG/2ML IJ SOLN
INTRAMUSCULAR | Status: AC
  Filled 2013-10-22: qty 2

## 2013-10-22 MED ORDER — OXYCODONE-ACETAMINOPHEN 5-325 MG PO TABS: 1.0000 | ORAL_TABLET | Freq: Four times a day (QID) | ORAL | Status: DC | PRN

## 2013-10-22 MED ORDER — PROPOFOL 10 MG/ML IV BOLUS
INTRAVENOUS | Status: DC | PRN
  Administered 2013-10-22: 200 mg via INTRAVENOUS

## 2013-10-22 MED ORDER — KETOROLAC TROMETHAMINE 30 MG/ML IJ SOLN
INTRAMUSCULAR | Status: AC
  Filled 2013-10-22: qty 1

## 2013-10-22 MED ORDER — PROPOFOL 10 MG/ML IV EMUL
INTRAVENOUS | Status: AC
  Filled 2013-10-22: qty 20

## 2013-10-22 MED ORDER — FENTANYL CITRATE 0.05 MG/ML IJ SOLN
INTRAMUSCULAR | Status: AC
  Filled 2013-10-22: qty 2

## 2013-10-22 MED ORDER — LACTATED RINGERS IV SOLN
INTRAVENOUS | Status: DC
  Administered 2013-10-22 (×2): via INTRAVENOUS

## 2013-10-22 MED ORDER — FENTANYL CITRATE 0.05 MG/ML IJ SOLN
INTRAMUSCULAR | Status: AC
  Filled 2013-10-22: qty 5

## 2013-10-22 MED ORDER — FERROUS GLUCONATE 324 (38 FE) MG PO TABS: 324.0000 mg | ORAL_TABLET | Freq: Two times a day (BID) | ORAL | Status: DC

## 2013-10-22 SURGICAL SUPPLY — 23 items
BLADE INCISOR TRUC PLUS 2.9 (ABLATOR) IMPLANT
CANISTERS HI-FLOW 3000CC (CANNISTER) ×3 IMPLANT
CATH ROBINSON RED A/P 16FR (CATHETERS) ×3 IMPLANT
CLOTH BEACON ORANGE TIMEOUT ST (SAFETY) ×3 IMPLANT
CONTAINER PREFILL 10% NBF 60ML (FORM) ×6 IMPLANT
DRAPE HYSTEROSCOPY (DRAPE) ×3 IMPLANT
DRSG TELFA 3X8 NADH (GAUZE/BANDAGES/DRESSINGS) ×3 IMPLANT
ELECT REM PT RETURN 9FT ADLT (ELECTROSURGICAL) ×3
ELECTRODE REM PT RTRN 9FT ADLT (ELECTROSURGICAL) ×1 IMPLANT
GLOVE BIO SURGEON STRL SZ 6 (GLOVE) ×3 IMPLANT
GLOVE BIOGEL PI IND STRL 6 (GLOVE) ×2 IMPLANT
GLOVE BIOGEL PI INDICATOR 6 (GLOVE) ×4
GOWN STRL REUS W/TWL LRG LVL3 (GOWN DISPOSABLE) ×6 IMPLANT
INCISOR TRUC PLUS BLADE 2.9 (ABLATOR) ×3
KIT HYSTEROSCOPY TRUCLEAR (ABLATOR) ×2 IMPLANT
MORCELLATOR RECIP TRUCLEAR 4.0 (ABLATOR) ×1 IMPLANT
NEEDLE SPNL 22GX3.5 QUINCKE BK (NEEDLE) ×3 IMPLANT
PACK VAGINAL MINOR WOMEN LF (CUSTOM PROCEDURE TRAY) ×3 IMPLANT
PAD DRESSING TELFA 3X8 NADH (GAUZE/BANDAGES/DRESSINGS) ×1 IMPLANT
PAD OB MATERNITY 4.3X12.25 (PERSONAL CARE ITEMS) ×3 IMPLANT
SYR CONTROL 10ML LL (SYRINGE) ×3 IMPLANT
TOWEL OR 17X24 6PK STRL BLUE (TOWEL DISPOSABLE) ×6 IMPLANT
WATER STERILE IRR 1000ML POUR (IV SOLUTION) IMPLANT

## 2013-10-22 NOTE — Op Note (Signed)
PREOPERATIVE DIAGNOSIS:  48 y.o. with irregular uterine bleeding  POSTOPERATIVE DIAGNOSIS: The same  PROCEDURE: Hysteroscopy, Dilation and Curettage, Truclear.  SURGEON:  Dr. Linda Hedges  INDICATIONS: 48 y.o. here for scheduled surgery for menorrhagia and suspected intracavitary polyp.   Risks of surgery were discussed with the patient including but not limited to: bleeding which may require transfusion; infection which may require antibiotics; injury to uterus or surrounding organs; intrauterine scarring which may impair future fertility; need for additional procedures including laparotomy or laparoscopy; and other postoperative/anesthesia complications. Written informed consent was obtained.    FINDINGS:  A 8 week size uterus.  Diffuse proliferative endometrium.  Unable to visualize tubal ostia secondary to thickened endometrium.  ANESTHESIA:   General  ESTIMATED BLOOD LOSS:  25cc  SPECIMENS: Endometrial curettings sent to pathology  COMPLICATIONS:  None immediate.  PROCEDURE DETAILS:  The patient was taken to the operating room where general anesthesia was administered and was found to be adequate.  After an adequate timeout was performed, she was placed in the dorsal lithotomy position and examined; then prepped and draped in the sterile manner.   Her bladder was catheterized for a small amount of clear, yellow urine. A speculum was then placed in the patient's vagina and a single tooth tenaculum was applied to the anterior lip of the cervix.   The uteus was sounded to 8 cm and no dilation was necessary to accommodate the Truclear.  The hysteroscope was inserted under direct visualization using saline as a suspension medium.  The uterine cavity was carefully examined with the above listed findings.  Two passes were taken with the Truclear with a small amount of tissue aspirated.  Secondary to extensive leakage from the os and the equipment, Truclear was abandoned because of inability to  calculate fluid deficit.  The hysteroscope was removed under direct visualization.  A sharp curettage was then performed to obtain a large amount of endometrial curettings.  A final look was taken with the 5.5 mm diagnostic hysteroscope with smooth cavity noted.  The hysteroscope was removed and the tenaculum was removed from the anterior lip of the cervix and the vaginal speculum was removed after noting good hemostasis.  The patient tolerated the procedure well and was taken to the recovery area awake, extubated and in stable condition.

## 2013-10-22 NOTE — Transfer of Care (Signed)
Immediate Anesthesia Transfer of Care Note  Patient: Katie Espinoza  Procedure(s) Performed: Procedure(s): DILATATION & CURETTAGE/HYSTEROSCOPY WITH TRUCLEAR (N/A)  Patient Location: PACU  Anesthesia Type:General  Level of Consciousness: awake, alert  and oriented  Airway & Oxygen Therapy: Patient Spontanous Breathing and Patient connected to nasal cannula oxygen  Post-op Assessment: Report given to PACU RN and Post -op Vital signs reviewed and stable  Post vital signs: Reviewed and stable  Complications: No apparent anesthesia complications

## 2013-10-22 NOTE — Progress Notes (Signed)
No change to H&P. 

## 2013-10-22 NOTE — Anesthesia Postprocedure Evaluation (Signed)
Anesthesia Post Note  Patient: Katie Espinoza  Procedure(s) Performed: Procedure(s) (LRB): DILATATION & CURETTAGE/HYSTEROSCOPY WITH TRUCLEAR (N/A)  Anesthesia type: General  Patient location: PACU  Post pain: Pain level controlled  Post assessment: Post-op Vital signs reviewed  Last Vitals:  Filed Vitals:   10/22/13 1445  BP: 104/54  Pulse: 63  Temp:   Resp: 18    Post vital signs: Reviewed  Level of consciousness: sedated  Complications: No apparent anesthesia complications

## 2013-10-22 NOTE — Anesthesia Preprocedure Evaluation (Signed)
Anesthesia Evaluation  Patient identified by MRN, date of birth, ID band Patient awake    Reviewed: Allergy & Precautions, H&P , NPO status , Patient's Chart, lab work & pertinent test results  Airway Mallampati: III TM Distance: >3 FB Neck ROM: Full    Dental  (+) Poor Dentition   Pulmonary asthma , former smoker,  breath sounds clear to auscultation  Pulmonary exam normal       Cardiovascular + dysrhythmias Rhythm:Regular Rate:Normal     Neuro/Psych  Headaches, negative psych ROS   GI/Hepatic Neg liver ROS, GERD-  Medicated and Controlled,  Endo/Other  negative endocrine ROS  Renal/GU negative Renal ROS  negative genitourinary   Musculoskeletal negative musculoskeletal ROS (+)   Abdominal (+) + obese,   Peds  Hematology  (+) anemia ,   Anesthesia Other Findings   Reproductive/Obstetrics Intrauterine mass Menometrorrhagia                           Anesthesia Physical Anesthesia Plan  ASA: II  Anesthesia Plan: General   Post-op Pain Management:    Induction: Intravenous  Airway Management Planned: LMA  Additional Equipment:   Intra-op Plan:   Post-operative Plan: Extubation in OR  Informed Consent: I have reviewed the patients History and Physical, chart, labs and discussed the procedure including the risks, benefits and alternatives for the proposed anesthesia with the patient or authorized representative who has indicated his/her understanding and acceptance.   Dental advisory given  Plan Discussed with: Anesthesiologist, CRNA and Surgeon  Anesthesia Plan Comments:         Anesthesia Quick Evaluation

## 2013-10-22 NOTE — Discharge Instructions (Signed)
Dilation and Curettage or Vacuum Curettage, Care After  DO NOT TAKE IBUPROFEN (ADVIL, ALEVE, OR MOTRIN) TILL AFTER 7:30 PM  Call MD for T>100.4, heavy vaginal bleeding, severe abdominal pain, intractable nausea and/or vomiting.  Call office to schedule postoperative appointment in 2 weeks.  No driving while taking narcotics.  Pelvic rest x 4 weeks.    Refer to this sheet in the next few weeks. These instructions provide you with information on caring for yourself after your procedure. Your health care provider may also give you more specific instructions. Your treatment has been planned according to current medical practices, but problems sometimes occur. Call your health care provider if you have any problems or questions after your procedure. WHAT TO EXPECT AFTER THE PROCEDURE After your procedure, it is typical to have light cramping and bleeding. This may last for 2 days to 2 weeks after the procedure. HOME CARE INSTRUCTIONS   Do not drive for 24 hours.  Wait 1 week before returning to strenuous activities.  Take your temperature 2 times a day for 4 days and write it down. Provide these temperatures to your health care provider if you develop a fever.  Avoid long periods of standing.  Avoid heavy lifting, pushing, or pulling. Do not lift anything heavier than 10 pounds (4.5 kg).  Limit stair climbing to once or twice a day.  Take rest periods often.  You may resume your usual diet.  Drink enough fluids to keep your urine clear or pale yellow.  Your usual bowel function should return. If you have constipation, you may:  Take a mild laxative with permission from your health care provider.  Add fruit and bran to your diet.  Drink more fluids.  Take showers instead of baths until your health care provider gives you permission to take baths.  Do not go swimming or use a hot tub until your health care provider approves.  Try to have someone with you or available to you the  first 24 48 hours, especially if you were given a general anesthetic.  Do not douche, use tampons, or have intercourse for 2 weeks after the procedure.  Only take over-the-counter or prescription medicines as directed by your health care provider. Do not take aspirin. It can cause bleeding.  Follow up with your health care provider as directed. SEEK MEDICAL CARE IF:   You have increasing cramps or pain that is not relieved with medicine.  You have abdominal pain that does not seem to be related to the same area of earlier cramping and pain.  You have bad smelling vaginal discharge.  You have a rash.  You are having problems with any medicine. SEEK IMMEDIATE MEDICAL CARE IF:   You have bleeding that is heavier than a normal menstrual period.  You have a fever.  You have chest pain.  You have shortness of breath.  You feel dizzy or feel like fainting.  You pass out.  You have pain in your shoulder strap area.  You have heavy vaginal bleeding with or without blood clots. Document Released: 07/14/2000 Document Revised: 05/07/2013 Document Reviewed: 02/13/2013 [facility] Patient Information 2014 [place], [place].

## 2013-10-23 ENCOUNTER — Encounter (HOSPITAL_COMMUNITY): Payer: Self-pay | Admitting: Obstetrics & Gynecology

## 2013-10-23 NOTE — Anesthesia Postprocedure Evaluation (Signed)
  Anesthesia Post-op Note  Patient: Katie Espinoza  Procedure(s) Performed: Procedure(s): DILATATION & CURETTAGE/HYSTEROSCOPY WITH TRUCLEAR (N/A)  Patient Location: PACU  Anesthesia Type:General  Level of Consciousness: awake, alert  and oriented  Airway and Oxygen Therapy: Patient Spontanous Breathing  Post-op Pain: none  Post-op Assessment: Post-op Vital signs reviewed, Patient's Cardiovascular Status Stable, Respiratory Function Stable, Patent Airway, No signs of Nausea or vomiting and Pain level controlled  Post-op Vital Signs: Reviewed and stable  Complications: No apparent anesthesia complications

## 2014-06-18 ENCOUNTER — Telehealth: Payer: Self-pay | Admitting: Family Medicine

## 2014-06-18 DIAGNOSIS — Z0000 Encounter for general adult medical examination without abnormal findings: Secondary | ICD-10-CM

## 2014-06-18 NOTE — Telephone Encounter (Signed)
-----   Message from Ellamae Sia sent at 06/11/2014  6:29 PM EST ----- Regarding: Lab orders for Friday, 11.20.15 Patient is scheduled for CPX labs, please order future labs, Thanks , Karna Christmas

## 2014-06-19 ENCOUNTER — Other Ambulatory Visit (INDEPENDENT_AMBULATORY_CARE_PROVIDER_SITE_OTHER): Payer: 59

## 2014-06-19 DIAGNOSIS — Z0000 Encounter for general adult medical examination without abnormal findings: Secondary | ICD-10-CM

## 2014-06-19 LAB — CBC WITH DIFFERENTIAL/PLATELET
Basophils Absolute: 0.1 10*3/uL (ref 0.0–0.1)
Basophils Relative: 0.7 % (ref 0.0–3.0)
EOS ABS: 0.2 10*3/uL (ref 0.0–0.7)
Eosinophils Relative: 1.9 % (ref 0.0–5.0)
HCT: 26 % — ABNORMAL LOW (ref 36.0–46.0)
Hemoglobin: 8.1 g/dL — ABNORMAL LOW (ref 12.0–15.0)
LYMPHS PCT: 19.2 % (ref 12.0–46.0)
Lymphs Abs: 1.5 10*3/uL (ref 0.7–4.0)
MCHC: 31.1 g/dL (ref 30.0–36.0)
MCV: 64.1 fl — ABNORMAL LOW (ref 78.0–100.0)
Monocytes Absolute: 0.6 10*3/uL (ref 0.1–1.0)
Monocytes Relative: 7.2 % (ref 3.0–12.0)
NEUTROS PCT: 71 % (ref 43.0–77.0)
Neutro Abs: 5.6 10*3/uL (ref 1.4–7.7)
Platelets: 351 10*3/uL (ref 150.0–400.0)
RBC: 4.05 Mil/uL (ref 3.87–5.11)
RDW: 19.5 % — ABNORMAL HIGH (ref 11.5–15.5)
WBC: 7.9 10*3/uL (ref 4.0–10.5)

## 2014-06-19 LAB — LIPID PANEL
Cholesterol: 168 mg/dL (ref 0–200)
HDL: 44.7 mg/dL (ref >39.00)
LDL CALC: 103 mg/dL — AB (ref 0–99)
NonHDL: 123.3
Total CHOL/HDL Ratio: 4
Triglycerides: 102 mg/dL (ref 0.0–149.0)
VLDL: 20.4 mg/dL (ref 0.0–40.0)

## 2014-06-19 LAB — COMPREHENSIVE METABOLIC PANEL
ALBUMIN: 3.4 g/dL — AB (ref 3.5–5.2)
ALT: 13 U/L (ref 0–35)
AST: 15 U/L (ref 0–37)
Alkaline Phosphatase: 56 U/L (ref 39–117)
BUN: 14 mg/dL (ref 6–23)
CALCIUM: 8.4 mg/dL (ref 8.4–10.5)
CO2: 26 mEq/L (ref 19–32)
Chloride: 108 mEq/L (ref 96–112)
Creatinine, Ser: 0.9 mg/dL (ref 0.4–1.2)
GFR: 72.15 mL/min (ref >60.00)
Glucose, Bld: 100 mg/dL — ABNORMAL HIGH (ref 70–99)
POTASSIUM: 4.7 meq/L (ref 3.5–5.1)
SODIUM: 138 meq/L (ref 135–145)
TOTAL PROTEIN: 6.2 g/dL (ref 6.0–8.3)
Total Bilirubin: 0.4 mg/dL (ref 0.2–1.2)

## 2014-06-19 LAB — TSH: TSH: 1.97 u[IU]/mL (ref 0.35–4.50)

## 2014-06-26 ENCOUNTER — Other Ambulatory Visit: Payer: 59

## 2014-07-03 ENCOUNTER — Encounter: Payer: Self-pay | Admitting: Family Medicine

## 2014-07-03 ENCOUNTER — Ambulatory Visit (INDEPENDENT_AMBULATORY_CARE_PROVIDER_SITE_OTHER): Payer: 59 | Admitting: Family Medicine

## 2014-07-03 VITALS — BP 112/64 | HR 87 | Temp 98.7°F | Ht 65.0 in | Wt 191.5 lb

## 2014-07-03 DIAGNOSIS — N920 Excessive and frequent menstruation with regular cycle: Secondary | ICD-10-CM

## 2014-07-03 DIAGNOSIS — Z23 Encounter for immunization: Secondary | ICD-10-CM

## 2014-07-03 DIAGNOSIS — Z0000 Encounter for general adult medical examination without abnormal findings: Secondary | ICD-10-CM

## 2014-07-03 DIAGNOSIS — D509 Iron deficiency anemia, unspecified: Secondary | ICD-10-CM

## 2014-07-03 NOTE — Progress Notes (Signed)
Pre visit review using our clinic review tool, if applicable. No additional management support is needed unless otherwise documented below in the visit note. 

## 2014-07-03 NOTE — Patient Instructions (Signed)
Stop at check out for referral to hematology  Take care of yourself  Follow up with gyn as planned

## 2014-07-03 NOTE — Progress Notes (Signed)
Subjective:    Patient ID: Katie Espinoza, female    DOB: May 31, 1966, 48 y.o.   MRN: [id number]  HPI Here for health maintenance exam and to review chronic medical problems    Wt is up 1.5 lb bmi of 31  Nothing new going on   Had a D and C 3/15 - had fibroid removed  R ovary - cyst  ? Something going on with L fallopian tube May need a hysterectomy  Did not have an ablation  Periods are still very heavy -- last menses lasted almost 40 days - (extremely heavy 6-7 of those days)   Will go back to gyn in Feb for annual exam   Still quite anemic Lab Results  Component Value Date   WBC 7.9 06/19/2014   HGB 8.1 Repeated and verified X2.* 06/19/2014   HCT 26.0 Repeated and verified X2.* 06/19/2014   MCV 64.1 Repeated and verified X2.* 06/19/2014   PLT 351.0 06/19/2014   is tired  She could not tolerate iron  Had IV iron once - and her level "did not change much"  Is open to doing it again   Has not had a flu shot - will do today  Mammogram 2/15 ok - she will set up her own  Self exam -no lumps  Td 6/11  Colonoscopy 2/07 - has family hx of colon cancer (her father had colon cancer) -- Dr Olevia Perches - said she did not need one for 13 y    Results for orders placed or performed in visit on 06/19/14  CBC with Differential  Result Value Ref Range   WBC 7.9 4.0 - 10.5 K/uL   RBC 4.05 3.87 - 5.11 Mil/uL   Hemoglobin 8.1 Repeated and verified X2. (L) 12.0 - 15.0 g/dL   HCT 26.0 Repeated and verified X2. (L) 36.0 - 46.0 %   MCV 64.1 Repeated and verified X2. (L) 78.0 - 100.0 fl   MCHC 31.1 30.0 - 36.0 g/dL   RDW 19.5 (H) 11.5 - 15.5 %   Platelets 351.0 150.0 - 400.0 K/uL   Neutrophils Relative % 71.0 43.0 - 77.0 %   Lymphocytes Relative 19.2 12.0 - 46.0 %   Monocytes Relative 7.2 3.0 - 12.0 %   Eosinophils Relative 1.9 0.0 - 5.0 %   Basophils Relative 0.7 0.0 - 3.0 %   Neutro Abs 5.6 1.4 - 7.7 K/uL   Lymphs Abs 1.5 0.7 - 4.0 K/uL   Monocytes Absolute 0.6 0.1 - 1.0 K/uL   Eosinophils Absolute 0.2 0.0 - 0.7 K/uL   Basophils Absolute 0.1 0.0 - 0.1 K/uL  Comprehensive metabolic panel  Result Value Ref Range   Sodium 138 135 - 145 mEq/L   Potassium 4.7 3.5 - 5.1 mEq/L   Chloride 108 96 - 112 mEq/L   CO2 26 19 - 32 mEq/L   Glucose, Bld 100 (H) 70 - 99 mg/dL   BUN 14 6 - 23 mg/dL   Creatinine, Ser 0.9 0.4 - 1.2 mg/dL   Total Bilirubin 0.4 0.2 - 1.2 mg/dL   Alkaline Phosphatase 56 39 - 117 U/L   AST 15 0 - 37 U/L   ALT 13 0 - 35 U/L   Total Protein 6.2 6.0 - 8.3 g/dL   Albumin 3.4 (L) 3.5 - 5.2 g/dL   Calcium 8.4 8.4 - 10.5 mg/dL   GFR 72.15 >60.00 mL/min  Lipid panel  Result Value Ref Range   Cholesterol 168 0 - 200 mg/dL  Triglycerides 102.0 0.0 - 149.0 mg/dL   HDL 44.70 >39.00 mg/dL   VLDL 20.4 0.0 - 40.0 mg/dL   LDL Cholesterol 103 (H) 0 - 99 mg/dL   Total CHOL/HDL Ratio 4    NonHDL 123.30   TSH  Result Value Ref Range   TSH 1.97 0.35 - 4.50 uIU/mL    BP Readings from Last 3 Encounters:  07/03/14 112/64  10/22/13 106/69  07/23/13 126/64     Patient Active Problem List   Diagnosis Date Noted  . Heavy menses 09/17/2012  . Oral aphthous ulcer 07/02/2012  . Other screening mammogram 05/17/2012  . Anemia, iron deficiency 05/08/2011  . Family history of colon cancer 05/08/2011  . Encounter for routine gynecological examination 05/08/2011  . Routine general medical examination at a health care facility 05/01/2011  . 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 MIGRAINE HEADACHE 02/14/2007  . RHINITIS, ALLERGIC NEC 02/14/2007   Past Medical History  Diagnosis Date  . Uterine fibroid   . Dysrhythmia     increased heart rate at times  . Anemia     iron infusions in the past  . Asthma     childhood  . GERD (gastroesophageal reflux disease)   . Headache(784.0)     migraines  . Vaginal delivery 1994. 1997   Past Surgical History  Procedure Laterality Date  . Orif toe fracture    . Dilatation & curettage/hysteroscopy with trueclear N/A 10/22/2013     Procedure: DILATATION & CURETTAGE/HYSTEROSCOPY WITH TRUCLEAR;  Surgeon: Linda Hedges, DO;  Location: [facility];  Service: Gynecology;  Laterality: N/A;   History  Substance Use Topics  . Smoking status: Former Smoker    Types: Cigarettes    Quit date: 07/31/1990  . Smokeless tobacco: Never Used  . Alcohol Use: Yes     Comment: occasional   Family History  Problem Relation Age of Onset  . Cancer Father     colon  . Hyperlipidemia Mother    Allergies  Allergen Reactions  . Amoxicillin Rash  . Erythromycin Rash  . Penicillins Rash   Current Outpatient Prescriptions on File Prior to Visit  Medication Sig Dispense Refill  . fexofenadine (ALLEGRA) 60 MG tablet Take 60 mg by mouth daily.     Marland Kitchen ibuprofen (ADVIL) 800 MG tablet Take 1 tablet (800 mg total) by mouth every 6 (six) hours as needed. 30 tablet 1  . ranitidine (ZANTAC) 75 MG tablet Take 75 mg by mouth daily as needed for heartburn.     No current facility-administered medications on file prior to visit.      Review of Systems Review of Systems  Constitutional: Negative for fever, appetite change,  and unexpected weight change. pos for fatigue  Eyes: Negative for pain and visual disturbance.  Respiratory: Negative for cough and shortness of breath.   Cardiovascular: Negative for cp or palpitations    Gastrointestinal: Negative for nausea, diarrhea and constipation.  Genitourinary: Negative for urgency and frequency.  Skin: Negative for pallor or rash   Neurological: Negative for weakness, light-headedness, numbness and headaches.  Hematological: Negative for adenopathy. Does not bruise/bleed easily.  Psychiatric/Behavioral: Negative for dysphoric mood. The patient is not nervous/anxious.         Objective:   Physical Exam  Constitutional: She appears well-developed and well-nourished. No distress.  obese and well appearing   HENT:  Head: Normocephalic and atraumatic.  Right Ear: External ear normal.  Left Ear:  External ear normal.  Nose: Nose normal.  Mouth/Throat: Oropharynx is  clear and moist.  Eyes: Conjunctivae and EOM are normal. Pupils are equal, round, and reactive to light. Right eye exhibits no discharge. Left eye exhibits no discharge. No scleral icterus.  Mild conj pallor   Neck: Normal range of motion. Neck supple. No JVD present. No thyromegaly present.  Cardiovascular: Normal rate, regular rhythm, normal heart sounds and intact distal pulses.  Exam reveals no gallop.   Pulmonary/Chest: Effort normal and breath sounds normal. No respiratory distress. She has no wheezes. She has no rales.  Abdominal: Soft. Bowel sounds are normal. She exhibits no distension and no mass. There is no tenderness.  Musculoskeletal: She exhibits no edema or tenderness.  Lymphadenopathy:    She has no cervical adenopathy.  Neurological: She is alert. She has normal reflexes. No cranial nerve deficit. She exhibits normal muscle tone. Coordination normal.  Skin: Skin is warm and dry. No rash noted. No erythema. No pallor.  Psychiatric: She has a normal mood and affect.          Assessment & Plan:

## 2014-07-05 NOTE — Assessment & Plan Note (Signed)
Ongoing from menses  In addition -pt does not tolerate iron/ or absorb it well Has had one iron inf- states it did not help much I enc her to consider return to hematology- may need more than one treatment  Will refer  Also pt will disc heavy menses with gyn and consider hysterectomy

## 2014-07-05 NOTE — Assessment & Plan Note (Signed)
Reviewed health habits including diet and exercise and skin cancer prevention Reviewed appropriate screening tests for age  Also reviewed health mt list, fam hx and immunization status , as well as social and family history   Flu shot today Lab rev

## 2014-07-05 NOTE — Assessment & Plan Note (Signed)
Still occ s/p D and C  Anemic -iron def/significant  Enc her to disc further tx with her gyn

## 2014-08-06 ENCOUNTER — Ambulatory Visit: Payer: Self-pay | Admitting: Internal Medicine

## 2014-08-07 LAB — CANCER CENTER HEMOGLOBIN: HGB: 7.8 g/dL — AB (ref 12.0–16.0)

## 2014-08-07 LAB — FERRITIN: FERRITIN (ARMC): 2 ng/mL — AB (ref 8–388)

## 2014-08-31 ENCOUNTER — Ambulatory Visit: Payer: Self-pay | Admitting: Internal Medicine

## 2014-09-04 LAB — FERRITIN: Ferritin (ARMC): 168 ng/mL (ref 8–388)

## 2014-09-04 LAB — CANCER CENTER HEMOGLOBIN: HGB: 8.8 g/dL — ABNORMAL LOW (ref 12.0–16.0)

## 2014-09-29 ENCOUNTER — Ambulatory Visit
Admit: 2014-09-29 | Disposition: A | Discharge status: Home / Self Care | Payer: Self-pay | Attending: Internal Medicine | Admitting: Internal Medicine

## 2014-10-30 ENCOUNTER — Ambulatory Visit
Admit: 2014-10-30 | Disposition: A | Discharge status: Home / Self Care | Payer: Self-pay | Attending: Internal Medicine | Admitting: Internal Medicine

## 2014-11-13 LAB — FERRITIN: FERRITIN (ARMC): 16 ng/mL

## 2014-11-13 LAB — CANCER CENTER HEMOGLOBIN: HGB: 12.4 g/dL (ref 12.0–16.0)

## 2015-02-11 ENCOUNTER — Other Ambulatory Visit: Payer: Self-pay

## 2015-02-11 DIAGNOSIS — D509 Iron deficiency anemia, unspecified: Secondary | ICD-10-CM

## 2015-02-12 ENCOUNTER — Ambulatory Visit: Payer: Self-pay | Admitting: Hematology and Oncology

## 2015-02-12 ENCOUNTER — Other Ambulatory Visit: Payer: Self-pay

## 2015-02-19 ENCOUNTER — Inpatient Hospital Stay: Payer: 59

## 2015-02-19 ENCOUNTER — Ambulatory Visit
Admission: RE | Admit: 2015-02-19 | Discharge: 2015-02-19 | Disposition: A | Discharge status: Home / Self Care | Payer: 59 | Source: Ambulatory Visit

## 2015-02-19 ENCOUNTER — Inpatient Hospital Stay: Payer: 59 | Attending: Hematology and Oncology | Admitting: Hematology and Oncology

## 2015-02-19 ENCOUNTER — Other Ambulatory Visit: Payer: Self-pay | Admitting: Obstetrics & Gynecology

## 2015-02-19 ENCOUNTER — Other Ambulatory Visit: Payer: Self-pay

## 2015-02-19 VITALS — BP 101/66 | HR 71 | Temp 98.6°F | Ht 66.0 in

## 2015-02-19 DIAGNOSIS — N920 Excessive and frequent menstruation with regular cycle: Secondary | ICD-10-CM | POA: Diagnosis not present

## 2015-02-19 DIAGNOSIS — Z800 Family history of malignant neoplasm of digestive organs: Secondary | ICD-10-CM

## 2015-02-19 DIAGNOSIS — D509 Iron deficiency anemia, unspecified: Secondary | ICD-10-CM | POA: Insufficient documentation

## 2015-02-19 DIAGNOSIS — Z1231 Encounter for screening mammogram for malignant neoplasm of breast: Secondary | ICD-10-CM

## 2015-02-19 DIAGNOSIS — K219 Gastro-esophageal reflux disease without esophagitis: Secondary | ICD-10-CM | POA: Diagnosis not present

## 2015-02-19 DIAGNOSIS — Z79899 Other long term (current) drug therapy: Secondary | ICD-10-CM | POA: Diagnosis not present

## 2015-02-19 LAB — CBC WITH DIFFERENTIAL/PLATELET
Basophils Absolute: 0 10*3/uL (ref 0–0.1)
Basophils Relative: 1 %
Eosinophils Absolute: 0.1 10*3/uL (ref 0–0.7)
Eosinophils Relative: 2 %
HCT: 35.8 % (ref 35.0–47.0)
Hemoglobin: 11.4 g/dL — ABNORMAL LOW (ref 12.0–16.0)
Lymphocytes Relative: 21 %
Lymphs Abs: 1.3 10*3/uL (ref 1.0–3.6)
MCH: 26.1 pg (ref 26.0–34.0)
MCHC: 31.9 g/dL — ABNORMAL LOW (ref 32.0–36.0)
MCV: 81.6 fL (ref 80.0–100.0)
Monocytes Absolute: 0.4 10*3/uL (ref 0.2–0.9)
Monocytes Relative: 7 %
Neutro Abs: 4.1 10*3/uL (ref 1.4–6.5)
Neutrophils Relative %: 69 %
Platelets: 260 10*3/uL (ref 150–440)
RBC: 4.39 MIL/uL (ref 3.80–5.20)
RDW: 14.3 % (ref 11.5–14.5)
WBC: 6 10*3/uL (ref 3.6–11.0)

## 2015-02-19 LAB — HM MAMMOGRAPHY: HM MAMMO: NORMAL

## 2015-02-19 LAB — FERRITIN: Ferritin: 8 ng/mL — ABNORMAL LOW (ref 11–307)

## 2015-02-19 NOTE — Progress Notes (Signed)
[facility] day:  02/19/2015  Chief Complaint: Katie Espinoza is a 49 y.o. female with iron deficiency anemia who is seen for 3 month assessment.  HPI: The patient was last seen in the medical oncology clinic on 11/13/2014.  At that time, she was seen for initial assessment by me.   She was felt to have iron deficiency anemia econdary to heavy menses.  She had last received Feraheme on 10/02/2014.  Ferritin was 16 on 11/13/2014.  Her diet was good.  She denies any melena, hematochezia, or hematuria.  Energy level had improved.  She had a Physiological scientist and had lost 17-18 pounds in the past 7 weeks with a change in diet and exercise.  Labs at last visit included a hemoglobin of 12.4 and a ferritin of 16.  During the interim, she has continued to do well. She continues on a weight loss program with her daughter. She has now lost 30 pounds.  Her daughter has lost 40 pounds. She sates that she dropped carbs. She continues with a Physiological scientist. She works with [employer] on Fridays and at home once a week. She and her husband walk 2-3 miles a day.  She notes ongoing issues with heavy menstrual bleeding. She states that this started around 01/03/2015. She had heavy flow for 2 weeks and then continued bleeding almost every day (some days more than others). She has an appointment with gynecology, Dr. Linda Hedges, today. She scheduled for an ultrasound. She has had a history of a right ovarian cyst and something with her left fallopian tube.  She denies any other bleeding. She states her diet is good. She has a mammogram today. Today [birth year] her birthday.  Past Medical History  Diagnosis Date  . Uterine fibroid   . Dysrhythmia     increased heart rate at times  . Anemia     iron infusions in the past  . Asthma     childhood  . GERD (gastroesophageal reflux disease)   . Headache(784.0)     migraines  . Vaginal delivery 1994. 1997    Past Surgical History   Procedure Laterality Date  . Orif toe fracture    . Dilatation & curettage/hysteroscopy with trueclear N/A 10/22/2013    Procedure: DILATATION & CURETTAGE/HYSTEROSCOPY WITH TRUCLEAR;  Surgeon: Linda Hedges, DO;  Location: [facility];  Service: Gynecology;  Laterality: N/A;    Family History  Problem Relation Age of Onset  . Cancer Father     colon  . Hyperlipidemia Mother     Social History:  reports that she quit smoking about 24 years ago. Her smoking use included Cigarettes. She has never used smokeless tobacco. She reports that she drinks alcohol. She reports that she does not use illicit drugs.  The patient is alone today.  Allergies:  Allergies  Allergen Reactions  . Amoxicillin Rash  . Erythromycin Rash  . Penicillins Rash    Current Medications: Current Outpatient Prescriptions  Medication Sig Dispense Refill  . fexofenadine (ALLEGRA) 60 MG tablet Take 60 mg by mouth daily.     Marland Kitchen ibuprofen (ADVIL) 800 MG tablet Take 1 tablet (800 mg total) by mouth every 6 (six) hours as needed. 30 tablet 1  . Multiple Vitamin (MULTIVITAMIN) tablet Take 1 tablet by mouth daily.    . ranitidine (ZANTAC) 75 MG tablet Take 75 mg by mouth daily as needed for heartburn.     No current facility-administered medications for this visit.  Review of Systems:  GENERAL:  Feels good.  Active.  No fevers or sweats.  Weight loss of 30# since 08/2014 (see above). PERFORMANCE STATUS (ECOG):  1 HEENT:  No visual changes, runny nose, sore throat, mouth sores or tenderness. Lungs: No shortness of breath or cough.  No hemoptysis. Cardiac:  No chest pain, palpitations, orthopnea, or PND. GI:  No nausea, vomiting, diarrhea, constipation, melena or hematochezia. GU:  Ongoing vaginal bleeding.  No urgency, frequency, dysuria, or hematuria. Musculoskeletal:  No back pain.  No joint pain.  No muscle tenderness. Extremities:  No pain or swelling. Skin:  No rashes or skin changes. Neuro:  No headache, numbness  or weakness, balance or coordination issues. Endocrine:  No diabetes, thyroid issues, hot flashes or night sweats. Psych:  No mood changes, depression or anxiety. Pain:  No focal pain. Review of systems:  All other systems reviewed and found to be negative.  Physical Exam: Blood pressure 101/66, pulse 71, temperature 98.6 F (37 C), temperature source Tympanic, height 5\' 6"  (1.676 m). GENERAL:  Well developed, well nourished, sitting comfortably in the exam room in no acute distress. MENTAL STATUS:  Alert and oriented to person, place and time. HEAD:  Long auburn hair.  Normocephalic, atraumatic, face symmetric, no Cushingoid features. EYES:  Brown eyes.  Pupils equal round and reactive to light and accomodation.  No conjunctivitis or scleral icterus. ENT:  Oropharynx clear without lesion.  Tongue normal. Mucous membranes moist.  RESPIRATORY:  Clear to auscultation without rales, wheezes or rhonchi. CARDIOVASCULAR:  Regular rate and rhythm without murmur, rub or gallop. ABDOMEN:  Soft, non-tender, with active bowel sounds, and no hepatosplenomegaly.  No masses. SKIN:  No rashes, ulcers or lesions. EXTREMITIES: No edema, no skin discoloration or tenderness.  No palpable cords. LYMPH NODES: No palpable cervical, supraclavicular, axillary or inguinal adenopathy  NEUROLOGICAL: Unremarkable. PSYCH:  Appropriate.  Appointment on 02/19/2015  Component Date Value Ref Range Status  . WBC 02/19/2015 6.0  3.6 - 11.0 K/uL Final  . RBC 02/19/2015 4.39  3.80 - 5.20 MIL/uL Final  . Hemoglobin 02/19/2015 11.4* 12.0 - 16.0 g/dL Final  . HCT 02/19/2015 35.8  35.0 - 47.0 % Final  . MCV 02/19/2015 81.6  80.0 - 100.0 fL Final  . MCH 02/19/2015 26.1  26.0 - 34.0 pg Final  . MCHC 02/19/2015 31.9* 32.0 - 36.0 g/dL Final  . RDW 02/19/2015 14.3  11.5 - 14.5 % Final  . Platelets 02/19/2015 260  150 - 440 K/uL Final  . Neutrophils Relative % 02/19/2015 69   Final  . Neutro Abs 02/19/2015 4.1  1.4 - 6.5 K/uL  Final  . Lymphocytes Relative 02/19/2015 21   Final  . Lymphs Abs 02/19/2015 1.3  1.0 - 3.6 K/uL Final  . Monocytes Relative 02/19/2015 7   Final  . Monocytes Absolute 02/19/2015 0.4  0.2 - 0.9 K/uL Final  . Eosinophils Relative 02/19/2015 2   Final  . Eosinophils Absolute 02/19/2015 0.1  0 - 0.7 K/uL Final  . Basophils Relative 02/19/2015 1   Final  . Basophils Absolute 02/19/2015 0.0  0 - 0.1 K/uL Final    Assessment:  TERRIAH REGGIO is a 49 y.o. female with iron deficiency anemia felt secondary to heavy menses.  She has a family history of colon cancer (father and uncle age 69-58).  Colonoscopy approximately 5 years ago was negative.  She has never had an EGD or capsule study.    Menses are erratic.  She states  that her longest menses was 40 days (she has had this occur 3-4 times in the past 2 years).  She has undergone endometrial ablation for polyps.  Her menses have not been as heavy since that time.  She denies any other bruising or bleeding.  She has had no excess bleeding with surgery (wisdom tooth extraction age 49).  She received Feraheme in 2014.  She received Feraheme 510 mg on 08/28/2014 and 10/02/2014.  Ferritin was 168 on 09/04/2014, 68 on 09/11/2014, and 12 on 10/02/2014.  Hemoglobin was 8.8 on 09/04/2014, 9.9 on 09/11/2014, and 10.5 on 10/02/2014.  She has recurrent vaginal bleeding.  Her diet is good.  She denies any melena, hematochezia, or hematuria.  She has lost 30 pounds since 08/2014 with diet and exercise.  Plan: 1. Labs today:  CBC with diff, ferritin. 2. Follow-up with gynecology today. 3. Discuss plan for IV iron (Feraheme) if RBC become microcytic. 4. RTC in 2 months for labs (CBC with diff, ferritin). 5. RTC in 4 months for MD assess and labs (CBC with diff, ferritin).   Lequita Asal, MD  02/19/2015, 9:12 AM

## 2015-02-19 NOTE — Progress Notes (Signed)
Pt here today for follow up regarding IDA; pt c/o having period sine June 5th; seeing Dr. Linda Hedges gynocologist today; having mammogram today; c/o hair loss. Occasional dizziness

## 2015-02-20 ENCOUNTER — Encounter: Payer: Self-pay | Admitting: Hematology and Oncology

## 2015-02-22 ENCOUNTER — Encounter: Payer: Self-pay | Admitting: *Deleted

## 2015-02-22 ENCOUNTER — Encounter: Payer: Self-pay | Admitting: Family Medicine

## 2015-02-23 LAB — CYTOLOGY - PAP

## 2015-03-01 ENCOUNTER — Telehealth: Payer: Self-pay | Admitting: *Deleted

## 2015-03-01 NOTE — Telephone Encounter (Signed)
Discussed with Dr Grayland Ormond that hgb is fine if she is to have surgery now, but would be best to have labs and see Dr Mike Gip before surgery if time allows. Discussed with patient, she has not scheduled Hysterectomy as of yet and will call to scheule appt with Dr Mike Gip once she nkows when surgery will be

## 2015-03-02 NOTE — Telephone Encounter (Signed)
  I agree.  M  

## 2015-04-16 ENCOUNTER — Other Ambulatory Visit: Payer: Self-pay | Admitting: Obstetrics & Gynecology

## 2015-04-23 ENCOUNTER — Inpatient Hospital Stay: Payer: 59

## 2015-04-23 ENCOUNTER — Other Ambulatory Visit: Payer: Self-pay | Admitting: Hematology and Oncology

## 2015-04-23 ENCOUNTER — Encounter (HOSPITAL_BASED_OUTPATIENT_CLINIC_OR_DEPARTMENT_OTHER): Payer: Self-pay | Admitting: *Deleted

## 2015-04-23 ENCOUNTER — Inpatient Hospital Stay: Payer: 59 | Attending: Hematology and Oncology

## 2015-04-23 ENCOUNTER — Encounter (INDEPENDENT_AMBULATORY_CARE_PROVIDER_SITE_OTHER): Payer: Self-pay

## 2015-04-23 VITALS — BP 117/68 | HR 80 | Temp 97.4°F | Resp 20

## 2015-04-23 DIAGNOSIS — D509 Iron deficiency anemia, unspecified: Secondary | ICD-10-CM

## 2015-04-23 DIAGNOSIS — N920 Excessive and frequent menstruation with regular cycle: Secondary | ICD-10-CM | POA: Diagnosis not present

## 2015-04-23 LAB — CBC
HCT: 32.2 % — ABNORMAL LOW (ref 35.0–47.0)
Hemoglobin: 10.1 g/dL — ABNORMAL LOW (ref 12.0–16.0)
MCH: 23 pg — ABNORMAL LOW (ref 26.0–34.0)
MCHC: 31.3 g/dL — ABNORMAL LOW (ref 32.0–36.0)
MCV: 73.5 fL — ABNORMAL LOW (ref 80.0–100.0)
Platelets: 304 10*3/uL (ref 150–440)
RBC: 4.38 MIL/uL (ref 3.80–5.20)
RDW: 15.2 % — ABNORMAL HIGH (ref 11.5–14.5)
WBC: 6.9 10*3/uL (ref 3.6–11.0)

## 2015-04-23 LAB — FERRITIN: Ferritin: 3 ng/mL — ABNORMAL LOW (ref 11–307)

## 2015-04-23 MED ORDER — SODIUM CHLORIDE 0.9 % IV SOLN
Freq: Once | INTRAVENOUS | Status: AC
  Administered 2015-04-23: 15:00:00 via INTRAVENOUS
  Filled 2015-04-23: qty 1000

## 2015-04-23 MED ORDER — SODIUM CHLORIDE 0.9 % IV SOLN
510.0000 mg | Freq: Once | INTRAVENOUS | Status: AC
  Administered 2015-04-23: 510 mg via INTRAVENOUS
  Filled 2015-04-23: qty 17

## 2015-04-26 ENCOUNTER — Encounter (HOSPITAL_BASED_OUTPATIENT_CLINIC_OR_DEPARTMENT_OTHER): Payer: Self-pay | Admitting: *Deleted

## 2015-04-26 NOTE — Progress Notes (Signed)
NPO AFTER MN.  ARRIVE AT 0600.  TO GET LAB WORK DONE Friday 04-30-2015.

## 2015-04-30 DIAGNOSIS — N803 Endometriosis of pelvic peritoneum: Secondary | ICD-10-CM | POA: Diagnosis not present

## 2015-04-30 DIAGNOSIS — K5909 Other constipation: Secondary | ICD-10-CM | POA: Diagnosis not present

## 2015-04-30 DIAGNOSIS — D509 Iron deficiency anemia, unspecified: Secondary | ICD-10-CM | POA: Diagnosis not present

## 2015-04-30 DIAGNOSIS — Z880 Allergy status to penicillin: Secondary | ICD-10-CM | POA: Diagnosis not present

## 2015-04-30 DIAGNOSIS — N926 Irregular menstruation, unspecified: Secondary | ICD-10-CM | POA: Diagnosis not present

## 2015-04-30 DIAGNOSIS — N920 Excessive and frequent menstruation with regular cycle: Secondary | ICD-10-CM | POA: Diagnosis not present

## 2015-04-30 DIAGNOSIS — N800 Endometriosis of uterus: Secondary | ICD-10-CM | POA: Diagnosis not present

## 2015-04-30 DIAGNOSIS — D259 Leiomyoma of uterus, unspecified: Secondary | ICD-10-CM | POA: Diagnosis not present

## 2015-04-30 DIAGNOSIS — Z800 Family history of malignant neoplasm of digestive organs: Secondary | ICD-10-CM | POA: Diagnosis not present

## 2015-04-30 DIAGNOSIS — N946 Dysmenorrhea, unspecified: Secondary | ICD-10-CM | POA: Diagnosis not present

## 2015-04-30 DIAGNOSIS — Z881 Allergy status to other antibiotic agents status: Secondary | ICD-10-CM | POA: Diagnosis not present

## 2015-04-30 DIAGNOSIS — Z87891 Personal history of nicotine dependence: Secondary | ICD-10-CM | POA: Diagnosis not present

## 2015-04-30 LAB — COMPREHENSIVE METABOLIC PANEL
ALT: 17 U/L (ref 14–54)
AST: 18 U/L (ref 15–41)
Albumin: 4.1 g/dL (ref 3.5–5.0)
Alkaline Phosphatase: 49 U/L (ref 38–126)
Anion gap: 5 (ref 5–15)
BUN: 17 mg/dL (ref 6–20)
CHLORIDE: 105 mmol/L (ref 101–111)
CO2: 29 mmol/L (ref 22–32)
Calcium: 9.4 mg/dL (ref 8.9–10.3)
Creatinine, Ser: 0.98 mg/dL (ref 0.44–1.00)
Glucose, Bld: 91 mg/dL (ref 65–99)
POTASSIUM: 4.2 mmol/L (ref 3.5–5.1)
SODIUM: 139 mmol/L (ref 135–145)
Total Bilirubin: 0.6 mg/dL (ref 0.3–1.2)
Total Protein: 7 g/dL (ref 6.5–8.1)

## 2015-04-30 LAB — CBC
HCT: 33.4 % — ABNORMAL LOW (ref 36.0–46.0)
Hemoglobin: 10 g/dL — ABNORMAL LOW (ref 12.0–15.0)
MCH: 24 pg — ABNORMAL LOW (ref 26.0–34.0)
MCHC: 29.9 g/dL — ABNORMAL LOW (ref 30.0–36.0)
MCV: 80.1 fL (ref 78.0–100.0)
PLATELETS: 340 10*3/uL (ref 150–400)
RBC: 4.17 MIL/uL (ref 3.87–5.11)
RDW: 16.2 % — AB (ref 11.5–15.5)
WBC: 5.7 10*3/uL (ref 4.0–10.5)

## 2015-05-02 NOTE — H&P (Signed)
Katie Espinoza is an 49 y.o. female with heavy, irregular menstrual bleeding refractory to hormonal tx and D&C.  Endometrial bx benign.  Ultrasound shows 205 cc uterus with 3 small fibroids all measuring less than 2.5 cm.  Left adnexa with 3.7 cm cystic lesion likely representing hydrosalpinx.  Desires definitive management.    Pertinent Gynecological History: Menses: irregular occurring approximately every 10 days without intermenstrual spotting Bleeding: dysfunctional uterine bleeding Contraception: vasectomy DES exposure: unknown Blood transfusions: none Sexually transmitted diseases: no past history Previous GYN Procedures: DNC  Last mammogram: normal Date: 2016 Last pap: normal Date: 01/2015 OB History: G2, P2   Menstrual History: Menarche age: n/a  Patient's last menstrual period was 04/26/2015.    Past Medical History  Diagnosis Date  . Uterine fibroid   . Heavy menstrual bleeding   . Iron deficiency anemia     IV Iron infusions--  last infusion 09-23-22016  . Chronic constipation   . Wears glasses   . Migraine     Past Surgical History  Procedure Laterality Date  . Dilatation & curettage/hysteroscopy with trueclear N/A 10/22/2013    Procedure: DILATATION & CURETTAGE/HYSTEROSCOPY WITH TRUCLEAR;  Surgeon: Linda Hedges, DO;  Location: [facility];  Service: Gynecology;  Laterality: N/A;    Family History  Problem Relation Age of Onset  . Cancer Father     colon  . Hyperlipidemia Mother     Social History:  reports that she quit smoking about 24 years ago. Her smoking use included Cigarettes. She quit after 2 years of use. She has never used smokeless tobacco. She reports that she drinks alcohol. She reports that she does not use illicit drugs.  Allergies:  Allergies  Allergen Reactions  . Erythromycin Rash  . Penicillins Rash    No prescriptions prior to admission    ROS  Height 5' 5.5" (1.664 m), weight 168 lb (76.204 kg), last menstrual period  04/26/2015. Physical Exam  Constitutional: She is oriented to person, place, and time. She appears well-developed and well-nourished.  GI: Soft. There is no rebound and no guarding.  Neurological: She is alert and oriented to person, place, and time.  Skin: Skin is warm and dry.  Psychiatric: She has a normal mood and affect. Her behavior is normal.    No results found for this or any previous visit (from the past 24 hour(s)).  No results found.  Assessment/Plan: 49yo with heavy, irregular menstrual bleeding refractory to conservative measures -LAVH, B/L salingectomy -Patient counseled re: risk of bleeding, infection, scarring and damage to surrounding structures.  She understands the risk of needing to convert to abdominal procedure.  All questions were answered and the patient wishes to proceed.  Katie Espinoza, Long 05/02/2015, 9:04 PM

## 2015-05-03 NOTE — Anesthesia Preprocedure Evaluation (Signed)
Anesthesia Evaluation  Patient identified by MRN, date of birth, ID band Patient awake    Reviewed: Allergy & Precautions, H&P , NPO status , Patient's Chart, lab work & pertinent test results  Airway Mallampati: II  TM Distance: >3 FB Neck ROM: full    Dental no notable dental hx. (+) Dental Advisory Given, Teeth Intact   Pulmonary neg pulmonary ROS, former smoker,    Pulmonary exam normal breath sounds clear to auscultation       Cardiovascular Exercise Tolerance: Good negative cardio ROS Normal cardiovascular exam Rhythm:regular Rate:Normal     Neuro/Psych negative neurological ROS  negative psych ROS   GI/Hepatic negative GI ROS, Neg liver ROS,   Endo/Other  negative endocrine ROS  Renal/GU negative Renal ROS  negative genitourinary   Musculoskeletal   Abdominal   Peds  Hematology negative hematology ROS (+) anemia , hgb 10   Anesthesia Other Findings   Reproductive/Obstetrics negative OB ROS                             Anesthesia Physical Anesthesia Plan  ASA: II  Anesthesia Plan: General   Post-op Pain Management:    Induction: Intravenous  Airway Management Planned: Oral ETT  Additional Equipment:   Intra-op Plan:   Post-operative Plan: Extubation in OR  Informed Consent: I have reviewed the patients History and Physical, chart, labs and discussed the procedure including the risks, benefits and alternatives for the proposed anesthesia with the patient or authorized representative who has indicated his/her understanding and acceptance.   Dental Advisory Given  Plan Discussed with: CRNA and Surgeon  Anesthesia Plan Comments:         Anesthesia Quick Evaluation

## 2015-05-04 ENCOUNTER — Encounter (HOSPITAL_BASED_OUTPATIENT_CLINIC_OR_DEPARTMENT_OTHER): Payer: Self-pay | Admitting: *Deleted

## 2015-05-04 ENCOUNTER — Encounter (HOSPITAL_COMMUNITY)
Admission: RE | Disposition: A | Discharge status: Home / Self Care | Payer: Self-pay | Source: Ambulatory Visit | Attending: Obstetrics & Gynecology

## 2015-05-04 ENCOUNTER — Ambulatory Visit (HOSPITAL_BASED_OUTPATIENT_CLINIC_OR_DEPARTMENT_OTHER): Payer: 59 | Admitting: Anesthesiology

## 2015-05-04 ENCOUNTER — Observation Stay (HOSPITAL_BASED_OUTPATIENT_CLINIC_OR_DEPARTMENT_OTHER)
Admission: RE | Admit: 2015-05-04 | Discharge: 2015-05-05 | Disposition: A | Discharge status: Home / Self Care | Payer: 59 | Source: Ambulatory Visit | Attending: Obstetrics & Gynecology | Admitting: Obstetrics & Gynecology

## 2015-05-04 DIAGNOSIS — N926 Irregular menstruation, unspecified: Secondary | ICD-10-CM | POA: Insufficient documentation

## 2015-05-04 DIAGNOSIS — N803 Endometriosis of pelvic peritoneum: Secondary | ICD-10-CM | POA: Insufficient documentation

## 2015-05-04 DIAGNOSIS — D509 Iron deficiency anemia, unspecified: Secondary | ICD-10-CM | POA: Insufficient documentation

## 2015-05-04 DIAGNOSIS — K5909 Other constipation: Secondary | ICD-10-CM | POA: Insufficient documentation

## 2015-05-04 DIAGNOSIS — N920 Excessive and frequent menstruation with regular cycle: Secondary | ICD-10-CM | POA: Diagnosis not present

## 2015-05-04 DIAGNOSIS — Z881 Allergy status to other antibiotic agents status: Secondary | ICD-10-CM | POA: Insufficient documentation

## 2015-05-04 DIAGNOSIS — Z87891 Personal history of nicotine dependence: Secondary | ICD-10-CM | POA: Insufficient documentation

## 2015-05-04 DIAGNOSIS — N946 Dysmenorrhea, unspecified: Secondary | ICD-10-CM | POA: Insufficient documentation

## 2015-05-04 DIAGNOSIS — Z800 Family history of malignant neoplasm of digestive organs: Secondary | ICD-10-CM | POA: Insufficient documentation

## 2015-05-04 DIAGNOSIS — Z90710 Acquired absence of both cervix and uterus: Secondary | ICD-10-CM

## 2015-05-04 DIAGNOSIS — Z880 Allergy status to penicillin: Secondary | ICD-10-CM | POA: Insufficient documentation

## 2015-05-04 DIAGNOSIS — N800 Endometriosis of uterus: Secondary | ICD-10-CM | POA: Insufficient documentation

## 2015-05-04 DIAGNOSIS — D259 Leiomyoma of uterus, unspecified: Secondary | ICD-10-CM | POA: Insufficient documentation

## 2015-05-04 HISTORY — DX: Migraine, unspecified, not intractable, without status migrainosus: G43.909

## 2015-05-04 HISTORY — DX: Other constipation: K59.09

## 2015-05-04 HISTORY — DX: Iron deficiency anemia, unspecified: D50.9

## 2015-05-04 HISTORY — DX: Excessive and frequent menstruation with regular cycle: N92.0

## 2015-05-04 HISTORY — DX: Presence of spectacles and contact lenses: Z97.3

## 2015-05-04 HISTORY — PX: CYSTOSCOPY: SHX5120

## 2015-05-04 HISTORY — PX: BILATERAL SALPINGECTOMY: SHX5743

## 2015-05-04 HISTORY — PX: LAPAROSCOPIC ASSISTED VAGINAL HYSTERECTOMY: SHX5398

## 2015-05-04 LAB — POCT PREGNANCY, URINE: Preg Test, Ur: NEGATIVE

## 2015-05-04 SURGERY — HYSTERECTOMY, VAGINAL, LAPAROSCOPY-ASSISTED
Anesthesia: General | Site: Bladder

## 2015-05-04 MED ORDER — ALUM & MAG HYDROXIDE-SIMETH 200-200-20 MG/5ML PO SUSP
30.0000 mL | ORAL | Status: DC | PRN
  Filled 2015-05-04: qty 30

## 2015-05-04 MED ORDER — PROPOFOL 10 MG/ML IV BOLUS
INTRAVENOUS | Status: DC | PRN
  Administered 2015-05-04: 160 mg via INTRAVENOUS

## 2015-05-04 MED ORDER — LACTATED RINGERS IR SOLN
Status: DC | PRN
  Administered 2015-05-04: 3000 mL

## 2015-05-04 MED ORDER — ONDANSETRON HCL 4 MG PO TABS
4.0000 mg | ORAL_TABLET | Freq: Four times a day (QID) | ORAL | Status: DC | PRN
  Administered 2015-05-05: 4 mg via ORAL
  Filled 2015-05-04 (×2): qty 1

## 2015-05-04 MED ORDER — NEOSTIGMINE METHYLSULFATE 10 MG/10ML IV SOLN
INTRAVENOUS | Status: DC | PRN
  Administered 2015-05-04: 4 mg via INTRAVENOUS

## 2015-05-04 MED ORDER — FENTANYL CITRATE (PF) 100 MCG/2ML IJ SOLN
INTRAMUSCULAR | Status: AC
  Filled 2015-05-04: qty 2

## 2015-05-04 MED ORDER — SIMETHICONE 80 MG PO CHEW
80.0000 mg | CHEWABLE_TABLET | Freq: Four times a day (QID) | ORAL | Status: DC | PRN
  Administered 2015-05-05 (×2): 80 mg via ORAL
  Filled 2015-05-04 (×3): qty 1

## 2015-05-04 MED ORDER — FLUORESCEIN SODIUM 10 % IJ SOLN
INTRAMUSCULAR | Status: DC | PRN
  Administered 2015-05-04: 400 mg via INTRAVENOUS

## 2015-05-04 MED ORDER — ONDANSETRON HCL 4 MG/2ML IJ SOLN
4.0000 mg | Freq: Four times a day (QID) | INTRAMUSCULAR | Status: DC | PRN
  Administered 2015-05-04 – 2015-05-05 (×4): 4 mg via INTRAVENOUS
  Filled 2015-05-04 (×5): qty 2

## 2015-05-04 MED ORDER — BUPIVACAINE HCL (PF) 0.25 % IJ SOLN
INTRAMUSCULAR | Status: DC | PRN
  Administered 2015-05-04: 5 mL

## 2015-05-04 MED ORDER — DOCUSATE SODIUM 100 MG PO CAPS
100.0000 mg | ORAL_CAPSULE | Freq: Two times a day (BID) | ORAL | Status: DC
  Administered 2015-05-05: 100 mg via ORAL
  Filled 2015-05-04 (×2): qty 1

## 2015-05-04 MED ORDER — SODIUM CHLORIDE 0.9 % IR SOLN
Status: DC | PRN
  Administered 2015-05-04: 1000 mL via INTRAVESICAL

## 2015-05-04 MED ORDER — MIDAZOLAM HCL 2 MG/2ML IJ SOLN
INTRAMUSCULAR | Status: AC
  Filled 2015-05-04: qty 2

## 2015-05-04 MED ORDER — FENTANYL CITRATE (PF) 100 MCG/2ML IJ SOLN
INTRAMUSCULAR | Status: DC | PRN
  Administered 2015-05-04: 25 ug via INTRAVENOUS
  Administered 2015-05-04: 50 ug via INTRAVENOUS
  Administered 2015-05-04: 75 ug via INTRAVENOUS
  Administered 2015-05-04: 50 ug via INTRAVENOUS

## 2015-05-04 MED ORDER — GLYCOPYRROLATE 0.2 MG/ML IJ SOLN
INTRAMUSCULAR | Status: DC | PRN
  Administered 2015-05-04: 0.6 mg via INTRAVENOUS

## 2015-05-04 MED ORDER — ROCURONIUM BROMIDE 100 MG/10ML IV SOLN
INTRAVENOUS | Status: DC | PRN
  Administered 2015-05-04: 35 mg via INTRAVENOUS
  Administered 2015-05-04: 5 mg via INTRAVENOUS
  Administered 2015-05-04: 10 mg via INTRAVENOUS

## 2015-05-04 MED ORDER — LACTATED RINGERS IV SOLN
INTRAVENOUS | Status: DC
  Administered 2015-05-04 – 2015-05-05 (×3): via INTRAVENOUS
  Filled 2015-05-04: qty 1000

## 2015-05-04 MED ORDER — ACETAMINOPHEN 10 MG/ML IV SOLN
INTRAVENOUS | Status: DC | PRN
  Administered 2015-05-04: 1000 mg via INTRAVENOUS

## 2015-05-04 MED ORDER — SUCCINYLCHOLINE CHLORIDE 20 MG/ML IJ SOLN
INTRAMUSCULAR | Status: DC | PRN
  Administered 2015-05-04: 100 mg via INTRAVENOUS

## 2015-05-04 MED ORDER — DEXAMETHASONE SODIUM PHOSPHATE 4 MG/ML IJ SOLN
INTRAMUSCULAR | Status: DC | PRN
  Administered 2015-05-04: 10 mg via INTRAVENOUS

## 2015-05-04 MED ORDER — FENTANYL CITRATE (PF) 100 MCG/2ML IJ SOLN
25.0000 ug | INTRAMUSCULAR | Status: DC | PRN
  Administered 2015-05-04 (×3): 50 ug via INTRAVENOUS
  Filled 2015-05-04: qty 1

## 2015-05-04 MED ORDER — LACTATED RINGERS IV SOLN
INTRAVENOUS | Status: DC
  Administered 2015-05-04 (×4): via INTRAVENOUS
  Filled 2015-05-04: qty 1000

## 2015-05-04 MED ORDER — GENTAMICIN SULFATE 40 MG/ML IJ SOLN: INTRAMUSCULAR | Status: DC

## 2015-05-04 MED ORDER — FENTANYL CITRATE (PF) 100 MCG/2ML IJ SOLN
INTRAMUSCULAR | Status: AC
  Filled 2015-05-04: qty 6

## 2015-05-04 MED ORDER — MENTHOL 3 MG MT LOZG
1.0000 | LOZENGE | OROMUCOSAL | Status: DC | PRN
  Filled 2015-05-04: qty 9

## 2015-05-04 MED ORDER — OXYCODONE-ACETAMINOPHEN 5-325 MG PO TABS
1.0000 | ORAL_TABLET | ORAL | Status: DC | PRN
  Administered 2015-05-05: 1 via ORAL
  Administered 2015-05-05: 2 via ORAL
  Filled 2015-05-04: qty 2
  Filled 2015-05-04: qty 1
  Filled 2015-05-04: qty 2

## 2015-05-04 MED ORDER — LACTATED RINGERS IV SOLN
INTRAVENOUS | Status: DC
  Filled 2015-05-04: qty 1000

## 2015-05-04 MED ORDER — GENTAMICIN SULFATE 40 MG/ML IJ SOLN
INTRAVENOUS | Status: AC
  Administered 2015-05-04: 900 mL via INTRAVENOUS
  Filled 2015-05-04: qty 9.5

## 2015-05-04 MED ORDER — LIDOCAINE HCL (CARDIAC) 20 MG/ML IV SOLN
INTRAVENOUS | Status: DC | PRN
  Administered 2015-05-04: 60 mg via INTRAVENOUS

## 2015-05-04 MED ORDER — GENTAMICIN SULFATE 40 MG/ML IJ SOLN
Freq: Once | INTRAMUSCULAR | Status: DC
Start: 2015-05-04 — End: 2015-05-04

## 2015-05-04 MED ORDER — HYDROMORPHONE HCL 1 MG/ML IJ SOLN
0.2000 mg | INTRAMUSCULAR | Status: DC | PRN
  Administered 2015-05-04 (×2): 0.5 mg via INTRAVENOUS
  Administered 2015-05-04: 0.25 mg via INTRAVENOUS
  Administered 2015-05-04 – 2015-05-05 (×3): 0.5 mg via INTRAVENOUS
  Filled 2015-05-04 (×8): qty 1

## 2015-05-04 MED ORDER — EPHEDRINE SULFATE 50 MG/ML IJ SOLN
INTRAMUSCULAR | Status: DC | PRN
  Administered 2015-05-04 (×2): 15 mg via INTRAVENOUS
  Administered 2015-05-04: 10 mg via INTRAVENOUS

## 2015-05-04 MED ORDER — ONDANSETRON HCL 4 MG/2ML IJ SOLN
INTRAMUSCULAR | Status: DC | PRN
  Administered 2015-05-04: 4 mg via INTRAVENOUS

## 2015-05-04 SURGICAL SUPPLY — 81 items
APPLICATOR COTTON TIP 6IN STRL (MISCELLANEOUS) ×3 IMPLANT
BAG URINE DRAINAGE (UROLOGICAL SUPPLIES) ×5 IMPLANT
BANDAGE ADH SHEER 1  50/CT (GAUZE/BANDAGES/DRESSINGS) ×6 IMPLANT
BLADE SURG 11 STRL SS (BLADE) ×5 IMPLANT
CABLE HIGH FREQUENCY MONO STRZ (ELECTRODE) IMPLANT
CANISTER SUCTION 2500CC (MISCELLANEOUS) ×5 IMPLANT
CATH FOLEY 2WAY SLVR  5CC 14FR (CATHETERS) ×2
CATH FOLEY 2WAY SLVR 5CC 14FR (CATHETERS) ×3 IMPLANT
CATH ROBINSON RED A/P 16FR (CATHETERS) ×5 IMPLANT
CHLORAPREP W/TINT 26ML (MISCELLANEOUS) ×5 IMPLANT
CLEANER CAUTERY TIP 5X5 PAD (MISCELLANEOUS) IMPLANT
CLOSURE WOUND 1/4X4 (GAUZE/BANDAGES/DRESSINGS) ×1
COVER BACK TABLE 60X90IN (DRAPES) ×8 IMPLANT
COVER LIGHT HANDLE  1/PK (MISCELLANEOUS) ×4
COVER LIGHT HANDLE 1/PK (MISCELLANEOUS) ×2 IMPLANT
COVER MAYO STAND STRL (DRAPES) ×5 IMPLANT
DRAPE LG THREE QUARTER DISP (DRAPES) ×10 IMPLANT
DRAPE UNDERBUTTOCKS STRL (DRAPE) ×5 IMPLANT
ELECT LIGASURE LONG (ELECTRODE) ×3 IMPLANT
ELECT REM PT RETURN 9FT ADLT (ELECTROSURGICAL) ×5
ELECTRODE REM PT RTRN 9FT ADLT (ELECTROSURGICAL) ×1 IMPLANT
GLOVE BIO SURGEON STRL SZ 6 (GLOVE) ×13 IMPLANT
GLOVE BIO SURGEON STRL SZ 6.5 (GLOVE) ×8 IMPLANT
GLOVE BIO SURGEON STRL SZ7.5 (GLOVE) ×4 IMPLANT
GLOVE BIO SURGEONS STRL SZ 6.5 (GLOVE) ×4
GLOVE BIOGEL PI IND STRL 6 (GLOVE) ×6 IMPLANT
GLOVE BIOGEL PI IND STRL 6.5 (GLOVE) ×2 IMPLANT
GLOVE BIOGEL PI IND STRL 7.0 (GLOVE) ×6 IMPLANT
GLOVE BIOGEL PI INDICATOR 6 (GLOVE) ×2
GLOVE BIOGEL PI INDICATOR 6.5 (GLOVE) ×4
GLOVE BIOGEL PI INDICATOR 7.0 (GLOVE) ×4
GLOVE ECLIPSE 6.5 STRL STRAW (GLOVE) ×5 IMPLANT
GOWN STRL REUS W/ TWL LRG LVL3 (GOWN DISPOSABLE) ×10 IMPLANT
GOWN STRL REUS W/TWL LRG LVL3 (GOWN DISPOSABLE) ×20
HOLDER FOLEY CATH W/STRAP (MISCELLANEOUS) ×5 IMPLANT
IV LACTATED RINGER IRRG 3000ML (IV SOLUTION) ×5
IV LR IRRIG 3000ML ARTHROMATIC (IV SOLUTION) ×1 IMPLANT
IV NS 1000ML (IV SOLUTION) ×5
IV NS 1000ML BAXH (IV SOLUTION) ×1 IMPLANT
LIGASURE LAP L-HOOKWIRE 5 44CM (INSTRUMENTS) ×2 IMPLANT
LIQUID BAND (GAUZE/BANDAGES/DRESSINGS) ×3 IMPLANT
NDL HYPO 25X1 1.5 SAFETY (NEEDLE) ×3 IMPLANT
NDL INSUFFLATION 14GA 120MM (NEEDLE) IMPLANT
NDL SPNL 22GX3.5 QUINCKE BK (NEEDLE) IMPLANT
NEEDLE HYPO 25X1 1.5 SAFETY (NEEDLE) ×5 IMPLANT
NEEDLE INSUFFLATION 120MM (ENDOMECHANICALS) ×5 IMPLANT
NEEDLE INSUFFLATION 14GA 120MM (NEEDLE) ×5 IMPLANT
NEEDLE SPNL 22GX3.5 QUINCKE BK (NEEDLE) IMPLANT
NS IRRIG 500ML POUR BTL (IV SOLUTION) ×5 IMPLANT
PACK BASIN DAY SURGERY FS (CUSTOM PROCEDURE TRAY) ×5 IMPLANT
PAD CLEANER CAUTERY TIP 5X5 (MISCELLANEOUS) ×2
PAD OB MATERNITY 4.3X12.25 (PERSONAL CARE ITEMS) ×5 IMPLANT
PAD PREP 24X48 CUFFED NSTRL (MISCELLANEOUS) ×5 IMPLANT
PADDING ION DISPOSABLE (MISCELLANEOUS) ×5 IMPLANT
PENCIL BUTTON HOLSTER BLD 10FT (ELECTRODE) ×5 IMPLANT
SCISSORS LAP 5X35 DISP (ENDOMECHANICALS) IMPLANT
SET IRRIG TUBING LAPAROSCOPIC (IRRIGATION / IRRIGATOR) ×2 IMPLANT
SET IRRIG Y TYPE TUR BLADDER L (SET/KITS/TRAYS/PACK) ×3 IMPLANT
SHEET LAVH (DRAPES) ×5 IMPLANT
SOLUTION ANTI FOG 6CC (MISCELLANEOUS) ×5 IMPLANT
SPONGE LAP 4X18 X RAY DECT (DISPOSABLE) ×5 IMPLANT
STRIP CLOSURE SKIN 1/4X4 (GAUZE/BANDAGES/DRESSINGS) ×2 IMPLANT
SUT MNCRL 0 MO-4 VIOLET 18 CR (SUTURE) ×5 IMPLANT
SUT MNCRL AB 4-0 PS2 18 (SUTURE) ×3 IMPLANT
SUT MON AB 2-0 CT1 36 (SUTURE) ×8 IMPLANT
SUT MONOCRYL 0 MO 4 18  CR/8 (SUTURE) ×2
SUT VICRYL 0 TIES 12 18 (SUTURE) ×5 IMPLANT
SUT VICRYL 0 UR6 27IN ABS (SUTURE) ×5 IMPLANT
SYR BULB IRRIGATION 50ML (SYRINGE) ×5 IMPLANT
SYR CONTROL 10ML LL (SYRINGE) IMPLANT
SYRINGE 10CC LL (SYRINGE) ×15 IMPLANT
TOWEL OR 17X26 4PK STRL BLUE (TOWEL DISPOSABLE) ×15 IMPLANT
TRAY DSU PREP LF (CUSTOM PROCEDURE TRAY) ×5 IMPLANT
TROCAR XCEL NON-BLD 11X100MML (ENDOMECHANICALS) ×5 IMPLANT
TROCAR XCEL NON-BLD 5MMX100MML (ENDOMECHANICALS) ×5 IMPLANT
TUBE CONNECTING 12'X1/4 (SUCTIONS) ×2
TUBE CONNECTING 12X1/4 (SUCTIONS) ×8 IMPLANT
TUBING INSUFFLATION 10FT LAP (TUBING) ×5 IMPLANT
WARMER LAPAROSCOPE (MISCELLANEOUS) ×5 IMPLANT
WATER STERILE IRR 500ML POUR (IV SOLUTION) ×5 IMPLANT
YANKAUER SUCT BULB TIP NO VENT (SUCTIONS) ×5 IMPLANT

## 2015-05-04 NOTE — Transfer of Care (Signed)
Immediate Anesthesia Transfer of Care Note  Patient: Katie Espinoza  Procedure(s) Performed: Procedure(s) with comments: LAPAROSCOPIC ASSISTED VAGINAL HYSTERECTOMY (N/A) - need bed BILATERAL SALPINGECTOMY (Bilateral) CYSTOSCOPY (N/A)  Patient Location: PACU  Anesthesia Type:General  Level of Consciousness: awake, alert , oriented and patient cooperative  Airway & Oxygen Therapy: Patient Spontanous Breathing and Patient connected to nasal cannula oxygen  Post-op Assessment: Report given to RN and Post -op Vital signs reviewed and stable  Post vital signs: Reviewed and stable  Last Vitals:  Filed Vitals:   05/04/15 0607  BP: 112/62  Pulse: 68  Temp: 36.6 C  Resp: 16    Complications: No apparent anesthesia complications

## 2015-05-04 NOTE — Anesthesia Postprocedure Evaluation (Signed)
  Anesthesia Post-op Note  Patient: Katie Espinoza  Procedure(s) Performed: Procedure(s) (LRB): LAPAROSCOPIC ASSISTED VAGINAL HYSTERECTOMY (N/A) BILATERAL SALPINGECTOMY (Bilateral) CYSTOSCOPY (N/A)  Patient Location: PACU  Anesthesia Type: General  Level of Consciousness: awake and alert   Airway and Oxygen Therapy: Patient Spontanous Breathing  Post-op Pain: mild  Post-op Assessment: Post-op Vital signs reviewed, Patient's Cardiovascular Status Stable, Respiratory Function Stable, Patent Airway and No signs of Nausea or vomiting  Last Vitals:  Filed Vitals:   05/04/15 1100  BP: 113/58  Pulse: 56  Temp:   Resp: 11    Post-op Vital Signs: stable   Complications: No apparent anesthesia complications

## 2015-05-04 NOTE — Op Note (Signed)
PROCEDURE DATE: 05/04/2015  PREOPERATIVE DIAGNOSIS: Heavy menstrual bleeding, dysmenorrhea  POSTOPERATIVE DIAGNOSIS: The same  PROCEDURE: Laparoscopic Assisted Vaginal Hysterectomy, Bilateral Salpingectomy, Cystoscopy SURGEON: Dr. Linda Hedges  ASSISTANT: Dr. Arvella Nigh INDICATIONS: 49 y.o. G2P2 with heavy menstrual bleeding and dysmenorrhea desiring definitive surgical management. Risks of surgery were discussed with the patient including but not limited to: bleeding which may require transfusion or reoperation; infection which may require antibiotics; injury to bowel, bladder, ureters or other surrounding organs; need for additional procedures including laparotomy; thromboembolic phenomenon, incisional problems and other postoperative/anesthesia complications. Written informed consent was obtained.  FINDINGS: Minimally enlarged uterus, Normal ovaries and fallopian tubes bilaterally but left ovary is adherent to left pelvic sidewall.  Normal appendix.  Brown vesicular endometriosis lesions of left round ligament adherent to left pelvic sidewall.  Extremely thickened right uterosacral ligament. Normal upper abdomen. Normal internal bladder anatomy. ANESTHESIA: General  ESTIMATED BLOOD LOSS: 550 ml  SPECIMENS: Uterus, cervix, and bilateral fallopian tubes COMPLICATIONS: None immediate  PROCEDURE IN DETAIL: The patient received intravenous antibiotics and had sequential compression devices applied to her lower extremities while in the preoperative area. She was then taken to the operating room where general anesthesia was administered and was found to be adequate. She was placed in the dorsal lithotomy position, and was prepped and draped in a sterile manner. An in and out catheterization was performed. A uterine manipulator was then advanced into the uterus . After an adequate timeout was performed, attention was then turned to the patient's abdomen where a 10-mm skin incision was made in the umbilical  fold. The Veress needle was carefully introduced into the peritoneal cavity through the abdominal wall. Intraperitoneal placement was confirmed by drop in intraabdominal pressure with insufflation of carbon dioxide gas. Adequate pneumoperitoneum was obtained, and the 10/11 trocar and sleeve were then advanced without difficulty into the abdomen where intraabdominal placement was confirmed by the laparoscope. A survey of the patient's pelvis and abdomen revealed the above dictated anatomy. Suprapubic 72mm port was then placed under direct visualization. On the right side, the fallopian tube was dissected out and excised to allow for better visibility.  Next, the round ligament was clamped and transected with the Ligasure. The uteroovarian ligament was also clamped and transected. The leaves of the broad ligament were separated and serially transected. These procedures were then repeated on the left side, however, because the left ovary was so adherent to the left sidewall, careful attention was paid to ensure surrounding vasculature and bowel was safely away from the dissection.  The thickened uterosacral was then taken down to the level of the normal appearing left side using the Ligaure.  Hemostasis was noted.   At this point, attention was turned to the vaginal portion of the case. A weighted speculum was placed posteriorly, a Deaver anteriorly, and the cervix grasped with a thyroid tenaculum. Once the anterior and posterior reflections were identified, the cervix was circumscribed using the Bovie knife. Next, using Mayos, the posterior cul-de-sac was entered. The LigaSure was then used to grasp the uterosacrals which were coapted and cut. Next, the bladder reflection was identified. Using Metzenbaums, it was entered and palpation and direct visualization confirmed proper location. Next, using the LigaSure, the uterine arteries were coapted and cut bilaterally. The pedicles were visualized after coaptation and  were hemostatic. The same was performed sequentially cephalad until the uterus and cervix were removed. The pedicles were inspected and found to be hemostatic.  Next, the uterosacrals were tagged with monocryl bilaterally.  The posterior cuff and peritoneum were brought together in a running locked stitch. The uterosacrals were brought together in the midline cuff closure with a figure-of-8 stitch using monocryl followed by the remainder of the cuff closure in the same fashion. The cuff was inspected and found to be hemostatic.  Because of the adhesions in the left pelvic sidewall and inability to clearly visualize the ureter, cystoscopy was performed after IV fluorescein was given.  Competent bladder noted and strong bilateral ureteral jets were seen. Attention was returned to the abdomen were a second laparoscopic look was taken. All pedicles were hemostatic.  Insufflation was removed after all instruments were removed.  Infraumbilical fascial incision was closed with 2-0 Vicryl in a figure of eight stitch.  Both skin incisions were closed with 4-0 Vicryl subcuticular stitches. The patient tolerated the procedures well. All instruments, needles, and sponge counts were correct x 2. The patient was taken to the recovery room awake, extubated and in stable condition.

## 2015-05-04 NOTE — Progress Notes (Signed)
No change to H&P.  Katie Szumski, dO 

## 2015-05-04 NOTE — Anesthesia Procedure Notes (Signed)
Procedure Name: Intubation Date/Time: 05/04/2015 7:33 AM Performed by: Wanita Chamberlain Pre-anesthesia Checklist: Patient identified, Timeout performed, Emergency Drugs available, Suction available and Patient being monitored Patient Re-evaluated:Patient Re-evaluated prior to inductionOxygen Delivery Method: Circle system utilized Preoxygenation: Pre-oxygenation with 100% oxygen Intubation Type: IV induction Ventilation: Mask ventilation without difficulty Laryngoscope Size: Mac and 3 Grade View: Grade II Tube type: Oral Tube size: 7.0 mm Number of attempts: 1 Airway Equipment and Method: Stylet and Bite block Placement Confirmation: breath sounds checked- equal and bilateral and positive ETCO2 Secured at: 21 cm Tube secured with: Tape Dental Injury: Teeth and Oropharynx as per pre-operative assessment

## 2015-05-04 NOTE — Progress Notes (Signed)
Night of surgery (Late entry-patient examined around 130pm) C/O nausea after changing beds/movement; no emesis.  Pain controlled.  No CP/SOB.    VSS. AF. UOP adequate  Gen: A&O x 3 Abd: soft, approp TTP Ext: no c/c/e  POD#0 s/p LAVH, b/l salpingectomy, cysto -Continue pain and nausea control -AM labs pending -D/C foley and ivf in am -Advance diet -Ambulate  Linda Hedges, DO

## 2015-05-05 ENCOUNTER — Encounter (HOSPITAL_BASED_OUTPATIENT_CLINIC_OR_DEPARTMENT_OTHER): Payer: Self-pay | Admitting: Obstetrics & Gynecology

## 2015-05-05 DIAGNOSIS — N920 Excessive and frequent menstruation with regular cycle: Secondary | ICD-10-CM | POA: Diagnosis not present

## 2015-05-05 LAB — COMPREHENSIVE METABOLIC PANEL
ALT: 20 U/L (ref 14–54)
ANION GAP: 7 (ref 5–15)
AST: 26 U/L (ref 15–41)
Albumin: 3.4 g/dL — ABNORMAL LOW (ref 3.5–5.0)
Alkaline Phosphatase: 40 U/L (ref 38–126)
BUN: 12 mg/dL (ref 6–20)
CHLORIDE: 106 mmol/L (ref 101–111)
CO2: 28 mmol/L (ref 22–32)
CREATININE: 0.87 mg/dL (ref 0.44–1.00)
Calcium: 8.8 mg/dL — ABNORMAL LOW (ref 8.9–10.3)
Glucose, Bld: 117 mg/dL — ABNORMAL HIGH (ref 65–99)
POTASSIUM: 4 mmol/L (ref 3.5–5.1)
SODIUM: 141 mmol/L (ref 135–145)
Total Bilirubin: 0.1 mg/dL — ABNORMAL LOW (ref 0.3–1.2)
Total Protein: 5.7 g/dL — ABNORMAL LOW (ref 6.5–8.1)

## 2015-05-05 LAB — CBC
HCT: 27.7 % — ABNORMAL LOW (ref 36.0–46.0)
Hemoglobin: 8.5 g/dL — ABNORMAL LOW (ref 12.0–15.0)
MCH: 24.7 pg — AB (ref 26.0–34.0)
MCHC: 30.7 g/dL (ref 30.0–36.0)
MCV: 80.5 fL (ref 78.0–100.0)
PLATELETS: 273 10*3/uL (ref 150–400)
RBC: 3.44 MIL/uL — AB (ref 3.87–5.11)
RDW: 17.9 % — AB (ref 11.5–15.5)
WBC: 13.8 10*3/uL — ABNORMAL HIGH (ref 4.0–10.5)

## 2015-05-05 MED ORDER — DOCUSATE SODIUM 100 MG PO CAPS: 100.0000 mg | ORAL_CAPSULE | Freq: Two times a day (BID) | ORAL | Status: DC

## 2015-05-05 MED ORDER — IBUPROFEN 800 MG PO TABS: 800.0000 mg | ORAL_TABLET | Freq: Three times a day (TID) | ORAL | Status: DC | PRN

## 2015-05-05 MED ORDER — OXYCODONE-ACETAMINOPHEN 5-325 MG PO TABS: 1.0000 | ORAL_TABLET | ORAL | Status: DC | PRN

## 2015-05-05 NOTE — Discharge Instructions (Signed)
Call MD for T>100.4, heavy vaginal bleeding, severe abdominal pain, intractable nausea and/or vomiting, or respiratory distress.  Call office to schedule postop appointment in 2 weeks.  No driving while taking narcotics.  Pelvic rest x 6 weeks. °

## 2015-05-05 NOTE — Progress Notes (Signed)
No current c/o.  Pain and nausea well controlled.  Ambulating well.  Foley out this am.  No void yet.  Tolerating po.  No flatus.  VSS. AF. UOP adequate Labs reviewed.  Hgb 10->8.5  Gen: A&O x 3 Abd: soft, ND, bandaids in place with minimal staining Ext: no c/c/e  POD#1 s/p LAVH, b/l salpingectomy -D/C home once able to void -F/U in office in 2 weeks.  Linda Hedges, DO

## 2015-05-05 NOTE — Progress Notes (Signed)
Pt discharged home with spouse in stable condition. Discharge instructions and scripts given.  Pt and spouse verbalized understanding.

## 2015-05-05 NOTE — Discharge Summary (Signed)
Physician Discharge Summary  Patient ID: Katie Espinoza MRN: [id number] DOB/AGE: 48-Sep-1967 48 y.o.  Admit date: 05/04/2015 Discharge date: 05/05/2015  Admission Diagnoses:  Heavy, irregular menstrual bleeding, dysmenorrhea  Discharge Diagnoses:   SAA Active Problems:   S/P hysterectomy   Discharged Condition: good  Hospital Course: Patient was admitted for LAVH, b/l salpingectomy.  She underwent the planned procedure without complication.  Endometriosis was found intraoperatively with subsequent scarring along sidewall for which cystoscopy was performed intraoperatively and wnl.  The patient did well postoperatively and on POD#1 was meeting all goals.  She was discharged home with office f/u in 2 weeks.   Consults: None  Significant Diagnostic Studies: labs: Hgb 8.5  Treatments: surgery: LAVH, b/l salpingectomy, cystoscopy  Discharge Exam: Blood pressure 101/40, pulse 85, temperature 98.5 F (36.9 C), temperature source Oral, resp. rate 18, height 5' 0.5" (1.537 m), weight 168 lb 8 oz (76.431 kg), last menstrual period 04/26/2015, SpO2 98 %. General appearance: alert, cooperative and appears stated age GI: appropriate ttp, non-distended Extremities: extremities normal, atraumatic, no cyanosis or edema Incision/Wound: Bandaids in place with minimal stain  Disposition: 01-Home or Self Care  Discharge Instructions    Discharge patient    Complete by:  As directed             Medication List    TAKE these medications        BIOTIN PO  Take 1 tablet by mouth daily.     bisacodyl 5 MG EC tablet  Commonly known as:  DULCOLAX  Take 5 mg by mouth daily as needed for moderate constipation.     CENTRUM ADULTS Tabs  Take 1 tablet by mouth daily.     docusate sodium 100 MG capsule  Commonly known as:  COLACE  Take 1 capsule (100 mg total) by mouth 2 (two) times daily.     ferumoxytol 510 MG/17ML Soln injection  Commonly known as:  FERAHEME  Inject 510 mg into the vein  once.     ibuprofen 800 MG tablet  Commonly known as:  ADVIL,MOTRIN  Take 1 tablet (800 mg total) by mouth every 8 (eight) hours as needed.     oxyCODONE-acetaminophen 5-325 MG tablet  Commonly known as:  PERCOCET/ROXICET  Take 1-2 tablets by mouth every 4 (four) hours as needed (moderate to severe pain (when tolerating fluids)).     vitamin B-12 1000 MCG tablet  Commonly known as:  CYANOCOBALAMIN  Take 1,000 mcg by mouth daily.         SignedLinda Hedges 05/05/2015, 8:51 AM

## 2015-06-17 ENCOUNTER — Telehealth: Payer: Self-pay | Admitting: Family Medicine

## 2015-06-17 DIAGNOSIS — Z0000 Encounter for general adult medical examination without abnormal findings: Secondary | ICD-10-CM

## 2015-06-17 NOTE — Telephone Encounter (Signed)
-----   Message from Marchia Bond sent at 06/09/2015 11:18 AM EST ----- Regarding: Cpx labs Fri 11/18 need orders, thanks :-) Please order  future cpx labs for pt's upcoming lab appt. Thanks Aniceto Boss

## 2015-06-18 ENCOUNTER — Other Ambulatory Visit (INDEPENDENT_AMBULATORY_CARE_PROVIDER_SITE_OTHER): Payer: 59

## 2015-06-18 DIAGNOSIS — Z0000 Encounter for general adult medical examination without abnormal findings: Secondary | ICD-10-CM | POA: Diagnosis not present

## 2015-06-18 LAB — COMPREHENSIVE METABOLIC PANEL
ALBUMIN: 3.7 g/dL (ref 3.5–5.2)
ALK PHOS: 52 U/L (ref 39–117)
ALT: 9 U/L (ref 0–35)
AST: 12 U/L (ref 0–37)
BILIRUBIN TOTAL: 0.2 mg/dL (ref 0.2–1.2)
BUN: 14 mg/dL (ref 6–23)
CALCIUM: 9 mg/dL (ref 8.4–10.5)
CO2: 28 mEq/L (ref 19–32)
Chloride: 106 mEq/L (ref 96–112)
Creatinine, Ser: 0.89 mg/dL (ref 0.40–1.20)
GFR: 71.85 mL/min (ref >60.00)
Glucose, Bld: 91 mg/dL (ref 70–99)
POTASSIUM: 4.4 meq/L (ref 3.5–5.1)
Sodium: 140 mEq/L (ref 135–145)
TOTAL PROTEIN: 6.3 g/dL (ref 6.0–8.3)

## 2015-06-18 LAB — CBC WITH DIFFERENTIAL/PLATELET
BASOS ABS: 0 10*3/uL (ref 0.0–0.1)
Basophils Relative: 0.4 % (ref 0.0–3.0)
EOS PCT: 2.5 % (ref 0.0–5.0)
Eosinophils Absolute: 0.2 10*3/uL (ref 0.0–0.7)
HCT: 35.3 % — ABNORMAL LOW (ref 36.0–46.0)
Hemoglobin: 11.3 g/dL — ABNORMAL LOW (ref 12.0–15.0)
LYMPHS ABS: 1.5 10*3/uL (ref 0.7–4.0)
LYMPHS PCT: 19.5 % (ref 12.0–46.0)
MCHC: 31.9 g/dL (ref 30.0–36.0)
MCV: 77.4 fl — AB (ref 78.0–100.0)
MONOS PCT: 6.4 % (ref 3.0–12.0)
Monocytes Absolute: 0.5 10*3/uL (ref 0.1–1.0)
Neutro Abs: 5.3 10*3/uL (ref 1.4–7.7)
Neutrophils Relative %: 71.2 % (ref 43.0–77.0)
Platelets: 273 10*3/uL (ref 150.0–400.0)
RBC: 4.56 Mil/uL (ref 3.87–5.11)
RDW: 22 % — ABNORMAL HIGH (ref 11.5–15.5)
WBC: 7.5 10*3/uL (ref 4.0–10.5)

## 2015-06-18 LAB — LIPID PANEL
CHOLESTEROL: 182 mg/dL (ref 0–200)
HDL: 57.4 mg/dL (ref >39.00)
LDL Cholesterol: 107 mg/dL — ABNORMAL HIGH (ref 0–99)
NONHDL: 124.41
TRIGLYCERIDES: 89 mg/dL (ref 0.0–149.0)
Total CHOL/HDL Ratio: 3
VLDL: 17.8 mg/dL (ref 0.0–40.0)

## 2015-06-18 LAB — TSH: TSH: 2.3 u[IU]/mL (ref 0.35–4.50)

## 2015-06-25 ENCOUNTER — Encounter: Payer: 59 | Admitting: Family Medicine

## 2015-07-01 ENCOUNTER — Other Ambulatory Visit: Payer: Self-pay | Admitting: Hematology and Oncology

## 2015-07-02 ENCOUNTER — Inpatient Hospital Stay (HOSPITAL_BASED_OUTPATIENT_CLINIC_OR_DEPARTMENT_OTHER): Payer: 59 | Admitting: Hematology and Oncology

## 2015-07-02 ENCOUNTER — Encounter: Payer: Self-pay | Admitting: Family Medicine

## 2015-07-02 ENCOUNTER — Inpatient Hospital Stay: Payer: 59 | Attending: Hematology and Oncology

## 2015-07-02 ENCOUNTER — Ambulatory Visit (INDEPENDENT_AMBULATORY_CARE_PROVIDER_SITE_OTHER): Payer: 59 | Admitting: Family Medicine

## 2015-07-02 VITALS — BP 112/70 | HR 77 | Temp 95.9°F | Resp 18 | Ht 65.0 in | Wt 177.9 lb

## 2015-07-02 VITALS — BP 116/72 | HR 69 | Temp 98.3°F | Ht 65.0 in | Wt 175.0 lb

## 2015-07-02 DIAGNOSIS — K5900 Constipation, unspecified: Secondary | ICD-10-CM | POA: Diagnosis not present

## 2015-07-02 DIAGNOSIS — Z800 Family history of malignant neoplasm of digestive organs: Secondary | ICD-10-CM | POA: Diagnosis not present

## 2015-07-02 DIAGNOSIS — D500 Iron deficiency anemia secondary to blood loss (chronic): Secondary | ICD-10-CM | POA: Diagnosis not present

## 2015-07-02 DIAGNOSIS — Z79899 Other long term (current) drug therapy: Secondary | ICD-10-CM

## 2015-07-02 DIAGNOSIS — Z90710 Acquired absence of both cervix and uterus: Secondary | ICD-10-CM | POA: Insufficient documentation

## 2015-07-02 DIAGNOSIS — Z0000 Encounter for general adult medical examination without abnormal findings: Secondary | ICD-10-CM

## 2015-07-02 DIAGNOSIS — D509 Iron deficiency anemia, unspecified: Secondary | ICD-10-CM

## 2015-07-02 DIAGNOSIS — Z87891 Personal history of nicotine dependence: Secondary | ICD-10-CM | POA: Diagnosis not present

## 2015-07-02 LAB — CBC WITH DIFFERENTIAL/PLATELET
Basophils Absolute: 0.1 10*3/uL (ref 0–0.1)
Basophils Relative: 1 %
Eosinophils Absolute: 0.2 10*3/uL (ref 0–0.7)
Eosinophils Relative: 2 %
HCT: 36.9 % (ref 35.0–47.0)
Hemoglobin: 11.9 g/dL — ABNORMAL LOW (ref 12.0–16.0)
Lymphocytes Relative: 22 %
Lymphs Abs: 1.8 10*3/uL (ref 1.0–3.6)
MCH: 24.7 pg — ABNORMAL LOW (ref 26.0–34.0)
MCHC: 32.2 g/dL (ref 32.0–36.0)
MCV: 76.8 fL — ABNORMAL LOW (ref 80.0–100.0)
Monocytes Absolute: 0.6 10*3/uL (ref 0.2–0.9)
Monocytes Relative: 8 %
Neutro Abs: 5.6 10*3/uL (ref 1.4–6.5)
Neutrophils Relative %: 67 %
Platelets: 302 10*3/uL (ref 150–440)
RBC: 4.81 MIL/uL (ref 3.80–5.20)
RDW: 21.3 % — ABNORMAL HIGH (ref 11.5–14.5)
WBC: 8.3 10*3/uL (ref 3.6–11.0)

## 2015-07-02 LAB — FERRITIN: Ferritin: 7 ng/mL — ABNORMAL LOW (ref 11–307)

## 2015-07-02 NOTE — Patient Instructions (Signed)
Take care of yourself - with good diet and exercise  Labs are stable , anemia is improved  Will plan on colonoscopy when you turn 51

## 2015-07-02 NOTE — Progress Notes (Signed)
[facility] day:  07/02/2015  Chief Complaint: Katie Espinoza is a 49 y.o. female with iron deficiency anemia who is seen for 3 month assessment.  HPI: The patient was last seen in the medical oncology clinic on 02/19/2015.  At that time, she was seen for 3 month assessment. She noted ongoing heavy menstrual bleeding.  Her diet was good.  She denied any melena, hematochezia, or hematuria.  She had lost 30 pounds since 08/2014 with diet and exercise.  CBC included a hematocrit of 35.8, hemoglobin 11.4, MCV 81.6, platelets 260,000, white count 6000 with an ANC of 4100. Ferritin was 8.  She received Feraheme 510 mg on 04/23/2015.  CBC on 05/05/2015 included a hematocrit 27.7, hemoglobin 8.5, and MCV 80.5. Labs on 06/18/2015 included a hematocrit 35.3, hemoglobin 11.3, and MCV 77.4.  On 05/04/2015, she underwent laparoscopic assisted vaginal hysterectomy by Dr. Linda Hedges.  Pathology revealed a benign uterine leiomyoma and no evidence of malignancy.  Symptomatically, she has felt better since her hysterectomy.  Her energy level is slowly improving.  Her diet is modest (chicken and [place]).  She denies any melena, hematochezia, or hematuria.   Past Medical History  Diagnosis Date  . Uterine fibroid   . Heavy menstrual bleeding   . Iron deficiency anemia     IV Iron infusions--  last infusion 09-23-22016  . Chronic constipation   . Wears glasses   . Migraine     Past Surgical History  Procedure Laterality Date  . Dilatation & curettage/hysteroscopy with trueclear N/A 10/22/2013    Procedure: DILATATION & CURETTAGE/HYSTEROSCOPY WITH TRUCLEAR;  Surgeon: Linda Hedges, DO;  Location: [facility];  Service: Gynecology;  Laterality: N/A;  . Laparoscopic assisted vaginal hysterectomy N/A 05/04/2015    Procedure: LAPAROSCOPIC ASSISTED VAGINAL HYSTERECTOMY;  Surgeon: Linda Hedges, DO;  Location: [facility];  Service: Gynecology;  Laterality:  N/A;  need bed  . Bilateral salpingectomy Bilateral 05/04/2015    Procedure: BILATERAL SALPINGECTOMY;  Surgeon: Linda Hedges, DO;  Location: [place];  Service: Gynecology;  Laterality: Bilateral;  . Cystoscopy N/A 05/04/2015    Procedure: CYSTOSCOPY;  Surgeon: Linda Hedges, DO;  Location: [facility];  Service: Gynecology;  Laterality: N/A;    Family History  Problem Relation Age of Onset  . Cancer Father     colon  . Hyperlipidemia Mother     Social History:  reports that she quit smoking about 24 years ago. Her smoking use included Cigarettes. She quit after 2 years of use. She has never used smokeless tobacco. She reports that she drinks alcohol. She reports that she does not use illicit drugs.  The patient is alone today.  Allergies:  Allergies  Allergen Reactions  . Erythromycin Rash  . Penicillins Rash    Has patient had a PCN reaction causing immediate rash, facial/tongue/throat swelling, SOB or lightheadedness with hypotension: Yes Has patient had a PCN reaction causing severe rash involving mucus membranes or skin necrosis: No Has patient had a PCN reaction that required hospitalization No Has patient had a PCN reaction occurring within the last 10 years: No If all of the above answers are "NO", then may proceed with Cephalosporin use.     Current Medications: Current Outpatient Prescriptions  Medication Sig Dispense Refill  . BIOTIN PO Take 1 tablet by mouth daily.    . bisacodyl (DULCOLAX) 5 MG EC tablet Take 5 mg by mouth daily as needed for moderate  constipation.    . docusate sodium (COLACE) 100 MG capsule Take 1 capsule (100 mg total) by mouth 2 (two) times daily. 60 capsule 1  . ferumoxytol (FERAHEME) 510 MG/17ML SOLN injection Inject 510 mg into the vein once.    Marland Kitchen ibuprofen (ADVIL,MOTRIN) 800 MG tablet Take 1 tablet (800 mg total) by mouth every 8 (eight) hours as needed. 30 tablet 0  . Multiple Vitamins-Minerals (CENTRUM ADULTS)  TABS Take 1 tablet by mouth daily.    Marland Kitchen oxyCODONE-acetaminophen (PERCOCET/ROXICET) 5-325 MG tablet Take 1-2 tablets by mouth every 4 (four) hours as needed (moderate to severe pain (when tolerating fluids)). 30 tablet 0  . vitamin B-12 (CYANOCOBALAMIN) 1000 MCG tablet Take 1,000 mcg by mouth daily.      Review of Systems:  GENERAL:  Energy level improving.  No fevers or sweats.  Weight loss of 30# since 08/2014. PERFORMANCE STATUS (ECOG):  1 HEENT:  No visual changes, runny nose, sore throat, mouth sores or tenderness. Lungs: No shortness of breath or cough.  No hemoptysis. Cardiac:  No chest pain, palpitations, orthopnea, or PND. GI:  No nausea, vomiting, diarrhea, constipation, melena or hematochezia. GU:  Interval hysterectomy.  No urgency, frequency, dysuria, or hematuria. Musculoskeletal:  No back pain.  No joint pain.  No muscle tenderness. Extremities:  No pain or swelling. Skin:  No rashes or skin changes. Neuro:  No headache, numbness or weakness, balance or coordination issues. Endocrine:  No diabetes, thyroid issues, hot flashes or night sweats. Psych:  No mood changes, depression or anxiety. Pain:  No focal pain. Review of systems:  All other systems reviewed and found to be negative.  Physical Exam: Blood pressure 112/70, pulse 77, temperature 95.9 F (35.5 C), temperature source Tympanic, resp. rate 18, height 5\' 5"  (1.651 m), weight 177 lb 14.6 oz (80.7 kg). GENERAL:  Well developed, well nourished, woman sitting comfortably in the exam room in no acute distress. MENTAL STATUS:  Alert and oriented to person, place and time. HEAD:  Long auburn hair.  Normocephalic, atraumatic, face symmetric, no Cushingoid features. EYES:  Brown eyes.  Pupils equal round and reactive to light and accomodation.  No conjunctivitis or scleral icterus. ENT:  Oropharynx clear without lesion.  Tongue normal. Mucous membranes moist.  RESPIRATORY:  Clear to auscultation without rales, wheezes or  rhonchi. CARDIOVASCULAR:  Regular rate and rhythm without murmur, rub or gallop. ABDOMEN:  Soft, non-tender, with active bowel sounds, and no hepatosplenomegaly.  No masses. SKIN:  No rashes, ulcers or lesions. EXTREMITIES: No edema, no skin discoloration or tenderness.  No palpable cords. LYMPH NODES: No palpable cervical, supraclavicular, axillary or inguinal adenopathy  NEUROLOGICAL: Unremarkable. PSYCH:  Appropriate.  Appointment on 07/02/2015  Component Date Value Ref Range Status  . WBC 07/02/2015 8.3  3.6 - 11.0 K/uL Final  . RBC 07/02/2015 4.81  3.80 - 5.20 MIL/uL Final  . Hemoglobin 07/02/2015 11.9* 12.0 - 16.0 g/dL Final  . HCT 07/02/2015 36.9  35.0 - 47.0 % Final  . MCV 07/02/2015 76.8* 80.0 - 100.0 fL Final  . MCH 07/02/2015 24.7* 26.0 - 34.0 pg Final  . MCHC 07/02/2015 32.2  32.0 - 36.0 g/dL Final  . RDW 07/02/2015 21.3* 11.5 - 14.5 % Final  . Platelets 07/02/2015 302  150 - 440 K/uL Final  . Neutrophils Relative % 07/02/2015 67   Final  . Neutro Abs 07/02/2015 5.6  1.4 - 6.5 K/uL Final  . Lymphocytes Relative 07/02/2015 22   Final  . Lymphs Abs  07/02/2015 1.8  1.0 - 3.6 K/uL Final  . Monocytes Relative 07/02/2015 8   Final  . Monocytes Absolute 07/02/2015 0.6  0.2 - 0.9 K/uL Final  . Eosinophils Relative 07/02/2015 2   Final  . Eosinophils Absolute 07/02/2015 0.2  0 - 0.7 K/uL Final  . Basophils Relative 07/02/2015 1   Final  . Basophils Absolute 07/02/2015 0.1  0 - 0.1 K/uL Final  . Ferritin 07/02/2015 7* 11 - 307 ng/mL Final    Assessment:  Katie Espinoza is a 49 y.o. female with iron deficiency anemia felt secondary to heavy menses.  She has a family history of colon cancer (father and uncle age 27-58).  Colonoscopy approximately 5 years ago was negative.  She has never had an EGD or capsule study.    She underwent hysterectomy on 05/04/2015.  Previously, menses were erratic.  Her longest menses was 40 days (she has had this occur 3-4 times in the past 2 years).   She has undergone endometrial ablation for polyps.  She denies any other bruising or bleeding.  She has had no excess bleeding with surgery (wisdom tooth extraction age 27).  She received Feraheme in 2014.  She received Feraheme 510 mg on 08/28/2014, 10/02/2014, and 04/23/2015.  Ferritin was 168 on 09/04/2014, 68 on 09/11/2014, and 12 on 10/02/2014.  Hemoglobin was 8.8 on 09/04/2014, 9.9 on 09/11/2014, 10.5 on 10/02/2014, and 7 today.  Symptomatically, she has felt better since her hysterectomy.  Her energy level is slowly improving.  Her diet is modest (chicken and [place]).  She denies any melena, hematochezia, or hematuria.  RBC indices are still microcytic.  Hematocrit is normal.  Ferritin is 7.  Plan: 1. Labs today:  CBC with diff, ferritin. 2. Discuss follow-up with PCP to ensure normalization of MCV and adequate iron stores. 3. Anticipate labs (CBC with diff, ferritin) in 3 months with PCP. 4. Discuss continuation of iron rich foods (oral iron causes constipation). 5. RTC prn.   Lequita Asal, MD  07/02/2015, 3:09 PM

## 2015-07-02 NOTE — Progress Notes (Signed)
Subjective:    Patient ID: Katie Espinoza, female    DOB: 09-06-1965, 49 y.o.   MRN: [id number]  HPI Here for health maintenance exam and to review chronic medical problems    Wt is up 7 lb with bmi of 29 Taking care of herself  Will start working out at the first of the year - will be ready (post op)  HIV screen Would like to check that in the future   She works in Facilities manager office   Flu shot - does not want a flu shot   Mm 7/16  - normal  Self exam -no lumps , does check them   Pap 7/16 nl , had a hysterectomy in Oct It went well  Fibroids/large and heavy bleeding prior to that  Also endometriosis  Left ovaries  No hot flashes  Skin and hair changes are noted    Lab Results  Component Value Date   TSH 2.30 06/18/2015      Td 6/11  Colonoscopy 2/07- will be due at 51  No stool changes  Fam hx of colon cancer   Anemia - has iron tx today Lab Results  Component Value Date   WBC 7.5 06/18/2015   HGB 11.3* 06/18/2015   HCT 35.3* 06/18/2015   MCV 77.4* 06/18/2015   PLT 273.0 06/18/2015    Results for orders placed or performed in visit on 06/18/15  CBC with Differential/Platelet  Result Value Ref Range   WBC 7.5 4.0 - 10.5 K/uL   RBC 4.56 3.87 - 5.11 Mil/uL   Hemoglobin 11.3 (L) 12.0 - 15.0 g/dL   HCT 35.3 (L) 36.0 - 46.0 %   MCV 77.4 (L) 78.0 - 100.0 fl   MCHC 31.9 30.0 - 36.0 g/dL   RDW 22.0 (H) 11.5 - 15.5 %   Platelets 273.0 150.0 - 400.0 K/uL   Neutrophils Relative % 71.2 43.0 - 77.0 %   Lymphocytes Relative 19.5 12.0 - 46.0 %   Monocytes Relative 6.4 3.0 - 12.0 %   Eosinophils Relative 2.5 0.0 - 5.0 %   Basophils Relative 0.4 0.0 - 3.0 %   Neutro Abs 5.3 1.4 - 7.7 K/uL   Lymphs Abs 1.5 0.7 - 4.0 K/uL   Monocytes Absolute 0.5 0.1 - 1.0 K/uL   Eosinophils Absolute 0.2 0.0 - 0.7 K/uL   Basophils Absolute 0.0 0.0 - 0.1 K/uL  Comprehensive metabolic panel  Result Value Ref Range   Sodium 140 135 - 145 mEq/L   Potassium 4.4 3.5 - 5.1 mEq/L   Chloride 106 96 - 112 mEq/L   CO2 28 19 - 32 mEq/L   Glucose, Bld 91 70 - 99 mg/dL   BUN 14 6 - 23 mg/dL   Creatinine, Ser 0.89 0.40 - 1.20 mg/dL   Total Bilirubin 0.2 0.2 - 1.2 mg/dL   Alkaline Phosphatase 52 39 - 117 U/L   AST 12 0 - 37 U/L   ALT 9 0 - 35 U/L   Total Protein 6.3 6.0 - 8.3 g/dL   Albumin 3.7 3.5 - 5.2 g/dL   Calcium 9.0 8.4 - 10.5 mg/dL   GFR 71.85 >60.00 mL/min  Lipid panel  Result Value Ref Range   Cholesterol 182 0 - 200 mg/dL   Triglycerides 89.0 0.0 - 149.0 mg/dL   HDL 57.40 >39.00 mg/dL   VLDL 17.8 0.0 - 40.0 mg/dL   LDL Cholesterol 107 (H) 0 - 99 mg/dL   Total CHOL/HDL Ratio 3  NonHDL 124.41   TSH  Result Value Ref Range   TSH 2.30 0.35 - 4.50 uIU/mL    Good cholesterol profile overall - eats pretty healthy   Also working out with a trainer  Eating more protein and greens    Patient Active Problem List   Diagnosis Date Noted  . S/P hysterectomy 05/04/2015  . Heavy menses 09/17/2012  . Oral aphthous ulcer 07/02/2012  . Other screening mammogram 05/17/2012  . Anemia, iron deficiency 05/08/2011  . Family history of colon cancer 05/08/2011  . Encounter for routine gynecological examination 05/08/2011  . Routine general medical examination at a health care facility 05/01/2011  . 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 MIGRAINE HEADACHE 02/14/2007  . RHINITIS, ALLERGIC NEC 02/14/2007   Past Medical History  Diagnosis Date  . Uterine fibroid   . Heavy menstrual bleeding   . Iron deficiency anemia     IV Iron infusions--  last infusion 09-23-22016  . Chronic constipation   . Wears glasses   . Migraine    Past Surgical History  Procedure Laterality Date  . Dilatation & curettage/hysteroscopy with trueclear N/A 10/22/2013    Procedure: DILATATION & CURETTAGE/HYSTEROSCOPY WITH TRUCLEAR;  Surgeon: Linda Hedges, DO;  Location: [facility];  Service: Gynecology;  Laterality: N/A;  . Laparoscopic assisted vaginal hysterectomy N/A 05/04/2015    Procedure:  LAPAROSCOPIC ASSISTED VAGINAL HYSTERECTOMY;  Surgeon: Linda Hedges, DO;  Location: [place];  Service: Gynecology;  Laterality: N/A;  need bed  . Bilateral salpingectomy Bilateral 05/04/2015    Procedure: BILATERAL SALPINGECTOMY;  Surgeon: Linda Hedges, DO;  Location: [place];  Service: Gynecology;  Laterality: Bilateral;  . Cystoscopy N/A 05/04/2015    Procedure: CYSTOSCOPY;  Surgeon: Linda Hedges, DO;  Location: [facility];  Service: Gynecology;  Laterality: N/A;   Social History  Substance Use Topics  . Smoking status: Former Smoker -- 2 years    Types: Cigarettes    Quit date: 07/31/1990  . Smokeless tobacco: Never Used  . Alcohol Use: 0.0 oz/week    0 Standard drinks or equivalent per week     Comment: rare   Family History  Problem Relation Age of Onset  . Cancer Father     colon  . Hyperlipidemia Mother    Allergies  Allergen Reactions  . Erythromycin Rash  . Penicillins Rash   Current Outpatient Prescriptions on File Prior to Visit  Medication Sig Dispense Refill  . BIOTIN PO Take 1 tablet by mouth daily.    . bisacodyl (DULCOLAX) 5 MG EC tablet Take 5 mg by mouth daily as needed for moderate constipation.    . docusate sodium (COLACE) 100 MG capsule Take 1 capsule (100 mg total) by mouth 2 (two) times daily. 60 capsule 1  . ferumoxytol (FERAHEME) 510 MG/17ML SOLN injection Inject 510 mg into the vein once.    Marland Kitchen ibuprofen (ADVIL,MOTRIN) 800 MG tablet Take 1 tablet (800 mg total) by mouth every 8 (eight) hours as needed. 30 tablet 0  . Multiple Vitamins-Minerals (CENTRUM ADULTS) TABS Take 1 tablet by mouth daily.    Marland Kitchen oxyCODONE-acetaminophen (PERCOCET/ROXICET) 5-325 MG tablet Take 1-2 tablets by mouth every 4 (four) hours as needed (moderate to severe pain (when tolerating fluids)). 30 tablet 0  . vitamin B-12 (CYANOCOBALAMIN) 1000 MCG tablet Take 1,000 mcg by mouth daily.     No current facility-administered medications  on file prior to visit.     Review of Systems Review of Systems  Constitutional:  Negative for fever, appetite change, fatigue and unexpected weight change.  Eyes: Negative for pain and visual disturbance.  Respiratory: Negative for cough and shortness of breath.   Cardiovascular: Negative for cp or palpitations    Gastrointestinal: Negative for nausea, diarrhea and constipation.  Genitourinary: Negative for urgency and frequency.  Skin: Negative for pallor or rash   Neurological: Negative for weakness, light-headedness, numbness and headaches.  Hematological: Negative for adenopathy. Does not bruise/bleed easily.  Psychiatric/Behavioral: Negative for dysphoric mood. The patient is not nervous/anxious.         Objective:   Physical Exam  Constitutional: She appears well-developed and well-nourished. No distress.  overwt and well appearing   HENT:  Head: Normocephalic and atraumatic.  Right Ear: External ear normal.  Left Ear: External ear normal.  Nose: Nose normal.  Mouth/Throat: Oropharynx is clear and moist.  Eyes: Conjunctivae and EOM are normal. Pupils are equal, round, and reactive to light. Right eye exhibits no discharge. Left eye exhibits no discharge. No scleral icterus.  Neck: Normal range of motion. Neck supple. No JVD present. Carotid bruit is not present. No thyromegaly present.  Cardiovascular: Normal rate, regular rhythm, normal heart sounds and intact distal pulses.  Exam reveals no gallop.   Pulmonary/Chest: Effort normal and breath sounds normal. No respiratory distress. She has no wheezes. She has no rales.  Abdominal: Soft. Bowel sounds are normal. She exhibits no distension and no mass. There is no tenderness.  Musculoskeletal: She exhibits no edema or tenderness.  Lymphadenopathy:    She has no cervical adenopathy.  Neurological: She is alert. She has normal reflexes. No cranial nerve deficit. She exhibits normal muscle tone. Coordination normal.  Skin: Skin  is warm and dry. No rash noted. No erythema. No pallor.  Some solar lentigo  Psychiatric: She has a normal mood and affect.        Assessment & Plan:

## 2015-07-02 NOTE — Progress Notes (Signed)
Pre visit review using our clinic review tool, if applicable. No additional management support is needed unless otherwise documented below in the visit note. 

## 2015-07-02 NOTE — Progress Notes (Signed)
Patient is here for follow-up of IDA. Patient states that she has been feeling good and offers no complaints today. She had a hysterectomy about 8 weeks ago. She is hoping that this will help with her IDA.

## 2015-07-04 ENCOUNTER — Encounter: Payer: Self-pay | Admitting: Hematology and Oncology

## 2015-07-04 NOTE — Assessment & Plan Note (Addendum)
Reviewed health habits including diet and exercise and skin cancer prevention Reviewed appropriate screening tests for age  Also reviewed health mt list, fam hx and immunization status , as well as social and family history   See HPI Labs reviewed  Take care of yourself - with good diet and exercise  Labs are stable , anemia is improved  Will plan on colonoscopy when you turn 51

## 2015-08-01 HISTORY — PX: BREAST BIOPSY: SHX20

## 2016-03-17 ENCOUNTER — Encounter: Payer: Self-pay | Admitting: Family Medicine

## 2016-03-17 ENCOUNTER — Ambulatory Visit (INDEPENDENT_AMBULATORY_CARE_PROVIDER_SITE_OTHER): Payer: 59 | Admitting: Family Medicine

## 2016-03-17 VITALS — BP 124/74 | HR 72 | Temp 97.6°F | Wt 181.5 lb

## 2016-03-17 DIAGNOSIS — J3489 Other specified disorders of nose and nasal sinuses: Secondary | ICD-10-CM

## 2016-03-17 NOTE — Progress Notes (Signed)
   Subjective:    Patient ID: Katie Espinoza, female    DOB: Jul 03, 1966, 50 y.o.   MRN: [id number]  HPI This is a 50 yo female who presents today with runny nasal drainage. This started today and she has had several episodes while bending forward. The drainage was the color of "mountain dew," and about a tablespoon in quantity. No nasal congestion. No ear pain, no cough, no SOB, no wheeze. Has had some muscle aches and joint pain. She removed two very small ticks (leg/groin) in the last several weeks. No rash or fever. No unusual fatigue.   She has appointment for annual exam next month.   Past Medical History:  Diagnosis Date  . Chronic constipation   . Heavy menstrual bleeding   . Iron deficiency anemia    IV Iron infusions--  last infusion 09-23-22016  . Migraine   . Uterine fibroid   . Wears glasses    Past Surgical History:  Procedure Laterality Date  . BILATERAL SALPINGECTOMY Bilateral 05/04/2015   Procedure: BILATERAL SALPINGECTOMY;  Surgeon: Linda Hedges, DO;  Location: [place];  Service: Gynecology;  Laterality: Bilateral;  . CYSTOSCOPY N/A 05/04/2015   Procedure: CYSTOSCOPY;  Surgeon: Linda Hedges, DO;  Location: [facility];  Service: Gynecology;  Laterality: N/A;  . DILATATION & CURETTAGE/HYSTEROSCOPY WITH TRUECLEAR N/A 10/22/2013   Procedure: DILATATION & CURETTAGE/HYSTEROSCOPY WITH TRUCLEAR;  Surgeon: Linda Hedges, DO;  Location: [facility];  Service: Gynecology;  Laterality: N/A;  . LAPAROSCOPIC ASSISTED VAGINAL HYSTERECTOMY N/A 05/04/2015   Procedure: LAPAROSCOPIC ASSISTED VAGINAL HYSTERECTOMY;  Surgeon: Linda Hedges, DO;  Location: [facility];  Service: Gynecology;  Laterality: N/A;  need bed      Review of Systems Per HPI    Objective:   Physical Exam  Constitutional: She is oriented to person, place, and time. She appears well-developed and well-nourished. No distress.  HENT:  Head: Normocephalic and atraumatic.    Right Ear: Tympanic membrane, external ear and ear canal normal.  Left Ear: Tympanic membrane, external ear and ear canal normal.  Nose: Rhinorrhea present. Right sinus exhibits no maxillary sinus tenderness and no frontal sinus tenderness. Left sinus exhibits no maxillary sinus tenderness and no frontal sinus tenderness.  Mouth/Throat: Uvula is midline, oropharynx is clear and moist and mucous membranes are normal.  Eyes: Conjunctivae are normal.  Neck: Normal range of motion. Neck supple.  Cardiovascular: Normal rate, regular rhythm and normal heart sounds.   Pulmonary/Chest: Effort normal and breath sounds normal.  Neurological: She is alert and oriented to person, place, and time.  Skin: Skin is warm and dry. She is not diaphoretic.  Psychiatric: She has a normal mood and affect. Her behavior is normal. Judgment and thought content normal.  Vitals reviewed.     BP 124/74 (BP Location: Left Arm, Patient Position: Sitting, Cuff Size: Normal)   Pulse 72   Temp 97.6 F (36.4 C) (Oral)   Wt 181 lb 8 oz (82.3 kg)   LMP 04/26/2015   SpO2 97%   BMI 30.20 kg/m      Assessment & Plan:  1. Rhinorrhea - Provided written and verbal information regarding diagnosis and treatment. - can take OTC antihistamines prn - RTC precautions reviewed- fever/chills, rash, headache/visual disturbance   Clarene Reamer, FNP-BC  [facility], [place]  03/17/2016 2:00 PM

## 2016-03-17 NOTE — Progress Notes (Signed)
Pre visit review using our clinic review tool, if applicable. No additional management support is needed unless otherwise documented below in the visit note. 

## 2016-04-23 ENCOUNTER — Telehealth: Payer: Self-pay | Admitting: Family Medicine

## 2016-04-23 DIAGNOSIS — Z0000 Encounter for general adult medical examination without abnormal findings: Secondary | ICD-10-CM

## 2016-04-23 NOTE — Telephone Encounter (Signed)
-----   Message from Ellamae Sia sent at 04/20/2016  5:01 PM EDT ----- Regarding: Lab orders for Friday, 9.29.17 Patient is scheduled for CPX labs, please order future labs, Thanks , Karna Christmas

## 2016-04-28 ENCOUNTER — Other Ambulatory Visit (INDEPENDENT_AMBULATORY_CARE_PROVIDER_SITE_OTHER): Payer: 59

## 2016-04-28 ENCOUNTER — Telehealth: Payer: Self-pay | Admitting: Family Medicine

## 2016-04-28 DIAGNOSIS — Z0000 Encounter for general adult medical examination without abnormal findings: Secondary | ICD-10-CM | POA: Diagnosis not present

## 2016-04-28 LAB — CBC WITH DIFFERENTIAL/PLATELET
BASOS PCT: 0.5 % (ref 0.0–3.0)
Basophils Absolute: 0 10*3/uL (ref 0.0–0.1)
EOS PCT: 1.8 % (ref 0.0–5.0)
Eosinophils Absolute: 0.1 10*3/uL (ref 0.0–0.7)
HEMATOCRIT: 38 % (ref 36.0–46.0)
Hemoglobin: 12.9 g/dL (ref 12.0–15.0)
LYMPHS ABS: 1.6 10*3/uL (ref 0.7–4.0)
LYMPHS PCT: 24.8 % (ref 12.0–46.0)
MCHC: 33.9 g/dL (ref 30.0–36.0)
MCV: 85.7 fl (ref 78.0–100.0)
MONOS PCT: 6.5 % (ref 3.0–12.0)
Monocytes Absolute: 0.4 10*3/uL (ref 0.1–1.0)
NEUTROS ABS: 4.2 10*3/uL (ref 1.4–7.7)
NEUTROS PCT: 66.4 % (ref 43.0–77.0)
PLATELETS: 234 10*3/uL (ref 150.0–400.0)
RBC: 4.44 Mil/uL (ref 3.87–5.11)
RDW: 13.1 % (ref 11.5–15.5)
WBC: 6.4 10*3/uL (ref 4.0–10.5)

## 2016-04-28 LAB — LIPID PANEL
CHOL/HDL RATIO: 4
CHOLESTEROL: 205 mg/dL — AB (ref 0–200)
HDL: 52.7 mg/dL (ref >39.00)
LDL CALC: 125 mg/dL — AB (ref 0–99)
NonHDL: 151.84
TRIGLYCERIDES: 136 mg/dL (ref 0.0–149.0)
VLDL: 27.2 mg/dL (ref 0.0–40.0)

## 2016-04-28 LAB — COMPREHENSIVE METABOLIC PANEL
ALT: 15 U/L (ref 0–35)
AST: 17 U/L (ref 0–37)
Albumin: 3.7 g/dL (ref 3.5–5.2)
Alkaline Phosphatase: 44 U/L (ref 39–117)
BILIRUBIN TOTAL: 0.3 mg/dL (ref 0.2–1.2)
BUN: 16 mg/dL (ref 6–23)
CALCIUM: 8.7 mg/dL (ref 8.4–10.5)
CO2: 29 meq/L (ref 19–32)
Chloride: 106 mEq/L (ref 96–112)
Creatinine, Ser: 0.86 mg/dL (ref 0.40–1.20)
GFR: 74.48 mL/min (ref >60.00)
GLUCOSE: 93 mg/dL (ref 70–99)
POTASSIUM: 4.4 meq/L (ref 3.5–5.1)
Sodium: 139 mEq/L (ref 135–145)
Total Protein: 6.5 g/dL (ref 6.0–8.3)

## 2016-04-28 LAB — TSH: TSH: 1.4 u[IU]/mL (ref 0.35–4.50)

## 2016-04-28 NOTE — Telephone Encounter (Signed)
Pt called requesting a referral for diagnostic mammo. She is having left breast pain and drainage. She called for screening and was told by [facility] she needed diagnostic. I made her an appt for Mon 10/2 and pt requested to move her cpe scheduled for 10/6. I left both on the schedule.

## 2016-04-28 NOTE — Telephone Encounter (Signed)
Pt called back and is aware of Dr. Alba Cory comments. Pt states she talked to her insurance and it will be covered.

## 2016-04-28 NOTE — Telephone Encounter (Signed)
Called pt and no answer and no voicemail set up 

## 2016-04-28 NOTE — Telephone Encounter (Signed)
If she has a 30 minute visit on 10/2 we can fit her physical in then - the only issue may be insurance coverage if we bring up a new problem code for breast abnormality - I don't know if her ins would charge her more or not   (but it makes sense to do it all then if possible)

## 2016-05-01 ENCOUNTER — Encounter: Payer: Self-pay | Admitting: Family Medicine

## 2016-05-01 ENCOUNTER — Ambulatory Visit (INDEPENDENT_AMBULATORY_CARE_PROVIDER_SITE_OTHER): Payer: 59 | Admitting: Family Medicine

## 2016-05-01 VITALS — BP 106/68 | HR 63 | Temp 98.2°F | Ht 64.75 in | Wt 180.5 lb

## 2016-05-01 DIAGNOSIS — Z800 Family history of malignant neoplasm of digestive organs: Secondary | ICD-10-CM

## 2016-05-01 DIAGNOSIS — D509 Iron deficiency anemia, unspecified: Secondary | ICD-10-CM

## 2016-05-01 DIAGNOSIS — Z0000 Encounter for general adult medical examination without abnormal findings: Secondary | ICD-10-CM | POA: Diagnosis not present

## 2016-05-01 DIAGNOSIS — N6452 Nipple discharge: Secondary | ICD-10-CM | POA: Diagnosis not present

## 2016-05-01 NOTE — Assessment & Plan Note (Signed)
Reviewed health habits including diet and exercise and skin cancer prevention Reviewed appropriate screening tests for age  Also reviewed health mt list, fam hx and immunization status , as well as social and family history   See HPI Labs reviewed Ref for colonoscopy and also for breast imaging  Urged her to get her flu shot at work as planned

## 2016-05-01 NOTE — Progress Notes (Signed)
Subjective:    Patient ID: Katie Espinoza, female    DOB: Jul 27, 1966, 50 y.o.   MRN: [id number]  HPI Here for health maintenance exam and to review chronic medical problems    Feeling good   Wt Readings from Last 3 Encounters:  05/01/16 180 lb 8 oz (81.9 kg)  03/17/16 181 lb 8 oz (82.3 kg)  07/02/15 177 lb 14.6 oz (80.7 kg)  overall stable  Still exercising regularly  Eating is fair - not a lot of junk food  bmi is 30.2  HIV screening - she is interested the next time you have labs   Mammogram 7/16-normal   She goes to the breast center  Self exam  Flu shot -has not had it and will get it later in the fall  She will get it at work   Pap 7/16-neg Dr Lynnette Caffey had a hysterectomy 10/16- fibroids  No more menses - she is quite relieved  Partial- left in ovaries  Will f/u upcoming    Tetanus shot 6/11  Family hx of colon cancer in father  2/07 Wants to go ahead and get it set up - it has been 10 y  Has hx of constipation as well   C/o breast discharge  Left breast  Some fluid that looks clear , area does ache a bit behind nipple  She first noticed it 3-4 mo (on and off)  No chance pregnant  Clear fluid - no blood and not green    Hx of anemia  On feraheme IV Lab Results  Component Value Date   WBC 6.4 04/28/2016   HGB 12.9 04/28/2016   HCT 38.0 04/28/2016   MCV 85.7 04/28/2016   PLT 234.0 04/28/2016     Patient Active Problem List   Diagnosis Date Noted  . Nipple discharge 05/01/2016  . S/P hysterectomy 05/04/2015  . Oral aphthous ulcer 07/02/2012  . Other screening mammogram 05/17/2012  . Anemia, iron deficiency 05/08/2011  . Family history of colon cancer 05/08/2011  . Encounter for routine gynecological examination 05/08/2011  . Routine general medical examination at a health care facility 05/01/2011  . 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 MIGRAINE HEADACHE 02/14/2007  . RHINITIS, ALLERGIC NEC 02/14/2007   Past Medical History:  Diagnosis Date  .  Chronic constipation   . Heavy menstrual bleeding   . Iron deficiency anemia    IV Iron infusions--  last infusion 09-23-22016  . Migraine   . Uterine fibroid   . Wears glasses    Past Surgical History:  Procedure Laterality Date  . BILATERAL SALPINGECTOMY Bilateral 05/04/2015   Procedure: BILATERAL SALPINGECTOMY;  Surgeon: Linda Hedges, DO;  Location: [place];  Service: Gynecology;  Laterality: Bilateral;  . CYSTOSCOPY N/A 05/04/2015   Procedure: CYSTOSCOPY;  Surgeon: Linda Hedges, DO;  Location: [facility];  Service: Gynecology;  Laterality: N/A;  . DILATATION & CURETTAGE/HYSTEROSCOPY WITH TRUECLEAR N/A 10/22/2013   Procedure: DILATATION & CURETTAGE/HYSTEROSCOPY WITH TRUCLEAR;  Surgeon: Linda Hedges, DO;  Location: [facility];  Service: Gynecology;  Laterality: N/A;  . LAPAROSCOPIC ASSISTED VAGINAL HYSTERECTOMY N/A 05/04/2015   Procedure: LAPAROSCOPIC ASSISTED VAGINAL HYSTERECTOMY;  Surgeon: Linda Hedges, DO;  Location: [place];  Service: Gynecology;  Laterality: N/A;  need bed   Social History  Substance Use Topics  . Smoking status: Former Smoker    Years: 2.00    Types: Cigarettes    Quit date: 07/31/1990  . Smokeless tobacco: Never Used  . Alcohol  use 0.0 oz/week     Comment: rare   Family History  Problem Relation Age of Onset  . Cancer Father     colon  . Hyperlipidemia Mother    Allergies  Allergen Reactions  . Erythromycin Rash  . Penicillins Rash   Current Outpatient Prescriptions on File Prior to Visit  Medication Sig Dispense Refill  . BIOTIN PO Take 1 tablet by mouth daily.    . bisacodyl (DULCOLAX) 5 MG EC tablet Take 5 mg by mouth daily as needed for moderate constipation.    . docusate sodium (COLACE) 100 MG capsule Take 1 capsule (100 mg total) by mouth 2 (two) times daily. 60 capsule 1  . ibuprofen (ADVIL,MOTRIN) 800 MG tablet Take 1 tablet (800 mg total) by mouth every 8 (eight) hours as needed. 30 tablet 0    . Multiple Vitamins-Minerals (CENTRUM ADULTS) TABS Take 1 tablet by mouth daily.    Marland Kitchen oxyCODONE-acetaminophen (PERCOCET/ROXICET) 5-325 MG tablet Take 1-2 tablets by mouth every 4 (four) hours as needed (moderate to severe pain (when tolerating fluids)). 30 tablet 0  . vitamin B-12 (CYANOCOBALAMIN) 1000 MCG tablet Take 1,000 mcg by mouth daily.     No current facility-administered medications on file prior to visit.     Review of Systems    Review of Systems  Constitutional: Negative for fever, appetite change, fatigue and unexpected weight change.  Eyes: Negative for pain and visual disturbance.  Respiratory: Negative for cough and shortness of breath.   Cardiovascular: Negative for cp or palpitations    Gastrointestinal: Negative for nausea, diarrhea and constipation.  Genitourinary: Negative for urgency and frequency. pos for L nipple d/c Skin: Negative for pallor or rash   Neurological: Negative for weakness, light-headedness, numbness and headaches.  Hematological: Negative for adenopathy. Does not bruise/bleed easily.  Psychiatric/Behavioral: Negative for dysphoric mood. The patient is not nervous/anxious.      Objective:   Physical Exam  Constitutional: She appears well-developed and well-nourished. No distress.  obese and well appearing   HENT:  Head: Normocephalic and atraumatic.  Right Ear: External ear normal.  Left Ear: External ear normal.  Mouth/Throat: Oropharynx is clear and moist.  Eyes: Conjunctivae and EOM are normal. Pupils are equal, round, and reactive to light. No scleral icterus.  Neck: Normal range of motion. Neck supple. No JVD present. Carotid bruit is not present. No thyromegaly present.  Cardiovascular: Normal rate, regular rhythm, normal heart sounds and intact distal pulses.  Exam reveals no gallop.   Pulmonary/Chest: Effort normal and breath sounds normal. No respiratory distress. She has no wheezes. She exhibits no tenderness.  Abdominal: Soft.  Bowel sounds are normal. She exhibits no distension, no abdominal bruit and no mass. There is no tenderness.  Genitourinary: No breast swelling, tenderness, discharge or bleeding.  Genitourinary Comments: Breast exam: No mass, nodules, thickening, tenderness, bulging, retraction, inflamation, nipple discharge or skin changes noted.  No axillary or clavicular LA.    L breast has an inverted nipple (baseline) and no fluid could be expressed from it   Musculoskeletal: Normal range of motion. She exhibits no edema or tenderness.  Lymphadenopathy:    She has no cervical adenopathy.  Neurological: She is alert. She has normal reflexes. No cranial nerve deficit. She exhibits normal muscle tone. Coordination normal.  Skin: Skin is warm and dry. No rash noted. No erythema. No pallor.  Dermatofibroma on back  Solar lentigines  Area of hyperpigmentation on chest   Psychiatric: She has a normal mood  and affect.          Assessment & Plan:   Problem List Items Addressed This Visit      Other   Anemia, iron deficiency    Resolved after hysterectomy and cessation of menses      Family history of colon cancer    Last colonoscopy 10 y ago  Referred for colonoscopy      Relevant Orders   Ambulatory referral to Gastroenterology   Nipple discharge    New- L breast/ clear  Inverted nipple baseline  No M palpated      Relevant Orders   MM DIAG BREAST TOMO BILATERAL   US BREAST COMPLETE UNI LEFT INC AXILLA   US BREAST COMPLETE UNI RIGHT INC AXILLA   Routine general medical examination at a health care facility - Primary    Reviewed health habits including diet and exercise and skin cancer prevention Reviewed appropriate screening tests for age  Also reviewed health mt list, fam hx and immunization status , as well as social and family history   See HPI Labs reviewed Ref for colonoscopy and also for breast imaging  Urged her to get her flu shot at work as planned       Other Visit  Diagnoses   None.

## 2016-05-01 NOTE — Assessment & Plan Note (Signed)
Resolved after hysterectomy and cessation of menses

## 2016-05-01 NOTE — Patient Instructions (Addendum)
When you come in for labs in the future- mention that you want HIV screening  Stop at check out regarding referral for breast imaging  Keep working on healthy habits/ diet/exercise and sun protection

## 2016-05-01 NOTE — Assessment & Plan Note (Signed)
Last colonoscopy 10 y ago  Referred for colonoscopy

## 2016-05-01 NOTE — Assessment & Plan Note (Signed)
New- L breast/ clear  Inverted nipple baseline  No M palpated

## 2016-05-01 NOTE — Progress Notes (Signed)
Pre visit review using our clinic review tool, if applicable. No additional management support is needed unless otherwise documented below in the visit note. 

## 2016-05-02 ENCOUNTER — Telehealth: Payer: Self-pay | Admitting: Family Medicine

## 2016-05-02 DIAGNOSIS — N6452 Nipple discharge: Secondary | ICD-10-CM

## 2016-05-02 NOTE — Telephone Encounter (Signed)
Breast center aware order changed

## 2016-05-02 NOTE — Telephone Encounter (Signed)
Breast center needs ultra sound order changed to  [id number]   img [id number] thanks

## 2016-05-02 NOTE — Telephone Encounter (Signed)
Done-I could not cancel the other orders since it said appt was already made  Thanks

## 2016-05-05 ENCOUNTER — Other Ambulatory Visit: Payer: 59

## 2016-05-05 ENCOUNTER — Ambulatory Visit
Admission: RE | Admit: 2016-05-05 | Discharge: 2016-05-05 | Disposition: A | Discharge status: Home / Self Care | Payer: 59 | Source: Ambulatory Visit | Attending: Family Medicine | Admitting: Family Medicine

## 2016-05-05 ENCOUNTER — Encounter: Payer: 59 | Admitting: Family Medicine

## 2016-05-05 DIAGNOSIS — N6452 Nipple discharge: Secondary | ICD-10-CM

## 2016-05-17 ENCOUNTER — Telehealth: Payer: Self-pay | Admitting: Family Medicine

## 2016-05-17 NOTE — Telephone Encounter (Signed)
Good/thanks for letting me know

## 2016-05-17 NOTE — Telephone Encounter (Signed)
Pt not due for colonoscopy until 08/2017 per [facility].   Saw Dr. Olevia Perches GI referral cancelled at this time

## 2016-05-18 ENCOUNTER — Other Ambulatory Visit: Payer: Self-pay | Admitting: Surgery

## 2016-05-18 DIAGNOSIS — N6452 Nipple discharge: Secondary | ICD-10-CM

## 2016-06-02 ENCOUNTER — Encounter: Payer: Self-pay | Admitting: Family Medicine

## 2016-06-02 ENCOUNTER — Ambulatory Visit
Admission: RE | Admit: 2016-06-02 | Discharge: 2016-06-02 | Disposition: A | Discharge status: Home / Self Care | Payer: 59 | Source: Ambulatory Visit | Attending: Surgery | Admitting: Surgery

## 2016-06-02 ENCOUNTER — Ambulatory Visit (INDEPENDENT_AMBULATORY_CARE_PROVIDER_SITE_OTHER): Payer: 59 | Admitting: Family Medicine

## 2016-06-02 VITALS — BP 122/64 | HR 66 | Temp 98.4°F | Ht 64.75 in | Wt 182.2 lb

## 2016-06-02 DIAGNOSIS — N3000 Acute cystitis without hematuria: Secondary | ICD-10-CM | POA: Insufficient documentation

## 2016-06-02 DIAGNOSIS — R300 Dysuria: Secondary | ICD-10-CM | POA: Diagnosis not present

## 2016-06-02 DIAGNOSIS — N6452 Nipple discharge: Secondary | ICD-10-CM

## 2016-06-02 LAB — POC URINALSYSI DIPSTICK (AUTOMATED)
BILIRUBIN UA: NEGATIVE
Blood, UA: 25
Glucose, UA: NEGATIVE
KETONES UA: NEGATIVE
Nitrite, UA: NEGATIVE
Spec Grav, UA: 1.015
Urobilinogen, UA: 0.2
pH, UA: 6

## 2016-06-02 MED ORDER — CIPROFLOXACIN HCL 250 MG PO TABS: 250.0000 mg | ORAL_TABLET | Freq: Two times a day (BID) | ORAL | 0 refills | Status: DC

## 2016-06-02 MED ORDER — GADOBENATE DIMEGLUMINE 529 MG/ML IV SOLN
17.0000 mL | Freq: Once | INTRAVENOUS | Status: AC | PRN
  Administered 2016-06-02: 17 mL via INTRAVENOUS

## 2016-06-02 NOTE — Progress Notes (Signed)
Pre visit review using our clinic review tool, if applicable. No additional management support is needed unless otherwise documented below in the visit note. 

## 2016-06-02 NOTE — Assessment & Plan Note (Signed)
2 wk of symptoms on and off Pos ua  cx pending  Some mild L low back /flank pain  tx with cipro  Handout given  Update if not starting to improve in several days  or if worsening

## 2016-06-02 NOTE — Progress Notes (Signed)
Subjective:    Patient ID: Katie Espinoza, female    DOB: 1965-11-29, 50 y.o.   MRN: [id number]  HPI Here for uti symptoms  Going on for 2 wk-got better and then worse   Frequency and urgency  Spasms - pain to urinate  Now getting some pain in the R low back/flank -not severe   No hx of kidney stones   Urine looked pink one day last week and then improved   Drinking lots of water   No fever Some nausea  Used cystex  Also cranberry juice   Patient Active Problem List   Diagnosis Date Noted  . Acute cystitis 06/02/2016  . Discharge from left nipple 05/01/2016  . S/P hysterectomy 05/04/2015  . Oral aphthous ulcer 07/02/2012  . Other screening mammogram 05/17/2012  . Anemia, iron deficiency 05/08/2011  . Family history of colon cancer 05/08/2011  . Encounter for routine gynecological examination 05/08/2011  . Routine general medical examination at a health care facility 05/01/2011  . 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 MIGRAINE HEADACHE 02/14/2007  . RHINITIS, ALLERGIC NEC 02/14/2007   Past Medical History:  Diagnosis Date  . Chronic constipation   . Heavy menstrual bleeding   . Iron deficiency anemia    IV Iron infusions--  last infusion 09-23-22016  . Migraine   . Uterine fibroid   . Wears glasses    Past Surgical History:  Procedure Laterality Date  . BILATERAL SALPINGECTOMY Bilateral 05/04/2015   Procedure: BILATERAL SALPINGECTOMY;  Surgeon: Linda Hedges, DO;  Location: [place];  Service: Gynecology;  Laterality: Bilateral;  . CYSTOSCOPY N/A 05/04/2015   Procedure: CYSTOSCOPY;  Surgeon: Linda Hedges, DO;  Location: [facility];  Service: Gynecology;  Laterality: N/A;  . DILATATION & CURETTAGE/HYSTEROSCOPY WITH TRUECLEAR N/A 10/22/2013   Procedure: DILATATION & CURETTAGE/HYSTEROSCOPY WITH TRUCLEAR;  Surgeon: Linda Hedges, DO;  Location: [facility];  Service: Gynecology;  Laterality: N/A;  . LAPAROSCOPIC ASSISTED VAGINAL HYSTERECTOMY  N/A 05/04/2015   Procedure: LAPAROSCOPIC ASSISTED VAGINAL HYSTERECTOMY;  Surgeon: Linda Hedges, DO;  Location: [place];  Service: Gynecology;  Laterality: N/A;  need bed   Social History  Substance Use Topics  . Smoking status: Former Smoker    Years: 2.00    Types: Cigarettes    Quit date: 07/31/1990  . Smokeless tobacco: Never Used  . Alcohol use 0.0 oz/week     Comment: rare   Family History  Problem Relation Age of Onset  . Cancer Father     colon  . Hyperlipidemia Mother    Allergies  Allergen Reactions  . Erythromycin Rash  . Penicillins Rash   Current Outpatient Prescriptions on File Prior to Visit  Medication Sig Dispense Refill  . BIOTIN PO Take 1 tablet by mouth daily.    . bisacodyl (DULCOLAX) 5 MG EC tablet Take 5 mg by mouth daily as needed for moderate constipation.    . docusate sodium (COLACE) 100 MG capsule Take 1 capsule (100 mg total) by mouth 2 (two) times daily. 60 capsule 1  . ibuprofen (ADVIL,MOTRIN) 800 MG tablet Take 1 tablet (800 mg total) by mouth every 8 (eight) hours as needed. 30 tablet 0  . Multiple Vitamins-Minerals (CENTRUM ADULTS) TABS Take 1 tablet by mouth daily.     No current facility-administered medications on file prior to visit.     Review of Systems  Constitutional: Positive for fatigue. Negative for activity change, appetite change and fever.  HENT: Negative for congestion  and sore throat.   Eyes: Negative for itching and visual disturbance.  Respiratory: Negative for cough and shortness of breath.   Cardiovascular: Negative for leg swelling.  Gastrointestinal: Negative for abdominal distention, abdominal pain, constipation, diarrhea and nausea.  Endocrine: Negative for cold intolerance and polydipsia.  Genitourinary: Positive for dysuria, frequency and urgency. Negative for difficulty urinating, flank pain and hematuria.  Musculoskeletal: Negative for myalgias.  Skin: Negative for rash.  Allergic/Immunologic:  Negative for immunocompromised state.  Neurological: Negative for dizziness and weakness.  Hematological: Negative for adenopathy.       Objective:   Physical Exam  Constitutional: She appears well-developed and well-nourished. No distress.  Well appearing   HENT:  Head: Normocephalic and atraumatic.  Eyes: Conjunctivae and EOM are normal. Pupils are equal, round, and reactive to light.  Neck: Normal range of motion. Neck supple.  Cardiovascular: Normal rate, regular rhythm and normal heart sounds.   Pulmonary/Chest: Effort normal and breath sounds normal.  Abdominal: Soft. Bowel sounds are normal. She exhibits no distension. There is tenderness. There is no rebound.  Mild L cva tenderness  Mild suprapubic tenderness (no rebound)  Musculoskeletal: She exhibits no edema.  Lymphadenopathy:    She has no cervical adenopathy.  Neurological: She is alert.  Skin: No rash noted.  Psychiatric: She has a normal mood and affect.          Assessment & Plan:   Problem List Items Addressed This Visit      Genitourinary   Acute cystitis - Primary    2 wk of symptoms on and off Pos ua  cx pending  Some mild L low back /flank pain  tx with cipro  Handout given  Update if not starting to improve in several days  or if worsening        Relevant Orders   Urine culture    Other Visit Diagnoses    Dysuria       Relevant Orders   POCT Urinalysis Dipstick (Automated) (Completed)

## 2016-06-02 NOTE — Patient Instructions (Addendum)
Drink lots of water Take cipro  Alert [place] if no improvement or if worse flank pain  If severe- go to the ER We will let you know when your urine culture comes back    Urinary Tract Infection Urinary tract infections (UTIs) can develop anywhere along your urinary tract. Your urinary tract is your body's drainage system for removing wastes and extra water. Your urinary tract includes two kidneys, two ureters, a bladder, and a urethra. Your kidneys are a pair of bean-shaped organs. Each kidney is about the size of your fist. They are located below your ribs, one on each side of your spine. CAUSES Infections are caused by microbes, which are microscopic organisms, including fungi, viruses, and bacteria. These organisms are so small that they can only be seen through a microscope. Bacteria are the microbes that most commonly cause UTIs. SYMPTOMS  Symptoms of UTIs may vary by age and gender of the patient and by the location of the infection. Symptoms in young women typically include a frequent and intense urge to urinate and a painful, burning feeling in the bladder or urethra during urination. Older women and men are more likely to be tired, shaky, and weak and have muscle aches and abdominal pain. A fever may mean the infection is in your kidneys. Other symptoms of a kidney infection include pain in your back or sides below the ribs, nausea, and vomiting. DIAGNOSIS To diagnose a UTI, your caregiver will ask you about your symptoms. Your caregiver will also ask you to provide a urine sample. The urine sample will be tested for bacteria and white blood cells. White blood cells are made by your body to help fight infection. TREATMENT  Typically, UTIs can be treated with medication. Because most UTIs are caused by a bacterial infection, they usually can be treated with the use of antibiotics. The choice of antibiotic and length of treatment depend on your symptoms and the type of bacteria causing your  infection. HOME CARE INSTRUCTIONS  If you were prescribed antibiotics, take them exactly as your caregiver instructs you. Finish the medication even if you feel better after you have only taken some of the medication.  Drink enough water and fluids to keep your urine clear or pale yellow.  Avoid caffeine, tea, and carbonated beverages. They tend to irritate your bladder.  Empty your bladder often. Avoid holding urine for long periods of time.  Empty your bladder before and after sexual intercourse.  After a bowel movement, women should cleanse from front to back. Use each tissue only once. SEEK MEDICAL CARE IF:   You have back pain.  You develop a fever.  Your symptoms do not begin to resolve within 3 days. SEEK IMMEDIATE MEDICAL CARE IF:   You have severe back pain or lower abdominal pain.  You develop chills.  You have nausea or vomiting.  You have continued burning or discomfort with urination. MAKE SURE YOU:   Understand these instructions.  Will watch your condition.  Will get help right away if you are not doing well or get worse.   This information is not intended to replace advice given to you by your health care provider. Make sure you discuss any questions you have with your health care provider.   Document Released: 04/26/2005 Document Revised: 04/07/2015 Document Reviewed: 08/25/2011 Elsevier Interactive Patient Education [employer].

## 2016-06-05 LAB — URINE CULTURE

## 2016-06-12 ENCOUNTER — Other Ambulatory Visit: Payer: Self-pay | Admitting: Surgery

## 2016-06-15 ENCOUNTER — Other Ambulatory Visit: Payer: Self-pay | Admitting: Family Medicine

## 2016-06-15 DIAGNOSIS — N6452 Nipple discharge: Secondary | ICD-10-CM

## 2016-06-30 ENCOUNTER — Other Ambulatory Visit: Payer: 59

## 2016-07-05 ENCOUNTER — Other Ambulatory Visit: Payer: Self-pay | Admitting: Surgery

## 2016-07-05 DIAGNOSIS — N6452 Nipple discharge: Secondary | ICD-10-CM

## 2016-07-07 ENCOUNTER — Ambulatory Visit
Admission: RE | Admit: 2016-07-07 | Discharge: 2016-07-07 | Disposition: A | Discharge status: Home / Self Care | Payer: 59 | Source: Ambulatory Visit | Attending: Family Medicine | Admitting: Family Medicine

## 2016-07-07 ENCOUNTER — Other Ambulatory Visit: Payer: Self-pay | Admitting: Surgery

## 2016-07-07 ENCOUNTER — Encounter: Payer: 59 | Admitting: Family Medicine

## 2016-07-07 ENCOUNTER — Ambulatory Visit
Admission: RE | Admit: 2016-07-07 | Discharge: 2016-07-07 | Disposition: A | Discharge status: Home / Self Care | Payer: 59 | Source: Ambulatory Visit | Attending: Surgery | Admitting: Surgery

## 2016-07-07 DIAGNOSIS — N6452 Nipple discharge: Secondary | ICD-10-CM

## 2016-07-07 DIAGNOSIS — N6310 Unspecified lump in the right breast, unspecified quadrant: Secondary | ICD-10-CM

## 2016-07-07 DIAGNOSIS — N6320 Unspecified lump in the left breast, unspecified quadrant: Secondary | ICD-10-CM

## 2016-07-14 ENCOUNTER — Other Ambulatory Visit: Payer: Self-pay | Admitting: Surgery

## 2016-07-14 DIAGNOSIS — R9389 Abnormal findings on diagnostic imaging of other specified body structures: Secondary | ICD-10-CM

## 2016-07-19 ENCOUNTER — Ambulatory Visit
Admission: RE | Admit: 2016-07-19 | Discharge: 2016-07-19 | Disposition: A | Discharge status: Home / Self Care | Payer: 59 | Source: Ambulatory Visit | Attending: Surgery | Admitting: Surgery

## 2016-07-19 DIAGNOSIS — R9389 Abnormal findings on diagnostic imaging of other specified body structures: Secondary | ICD-10-CM

## 2016-07-19 MED ORDER — GADOBENATE DIMEGLUMINE 529 MG/ML IV SOLN
17.0000 mL | Freq: Once | INTRAVENOUS | Status: AC | PRN
  Administered 2016-07-19: 17 mL via INTRAVENOUS

## 2016-08-18 DIAGNOSIS — N6452 Nipple discharge: Secondary | ICD-10-CM | POA: Diagnosis not present

## 2016-08-18 DIAGNOSIS — N6310 Unspecified lump in the right breast, unspecified quadrant: Secondary | ICD-10-CM | POA: Diagnosis not present

## 2016-09-18 ENCOUNTER — Encounter: Payer: Self-pay | Admitting: Family Medicine

## 2016-12-29 DIAGNOSIS — L718 Other rosacea: Secondary | ICD-10-CM | POA: Diagnosis not present

## 2017-01-17 IMAGING — MR MR BILATERAL BREAST WITHOUT AND WITH CONTRAST
7 of 12 series · 25 of 48 positions shown · IV contrast (multihance)
Comparison: Previous diagnostic mammogram and breast ultrasounds
dated 05/05/2016.

CLINICAL DATA: 50-year-old female with a history of spontaneous
left breast nipple discharge. Recent diagnostic mammogram with
bilateral breast ultrasound demonstrated a 7 mm, oval, circumscribed
nodule located within the right breast at the 11 o'clock position 4
cm from the nipple felt to represent a probable benign fibroadenoma
and no etiology for the left breast spontaneous nipple discharge.
The patient has been seen in surgical consultation by Dr. Forevar and
is referred for bilateral breast MRI.

LABS:  Not applicable
EXAM:
BILATERAL BREAST MRI WITH AND WITHOUT CONTRAST
TECHNIQUE: Multiplanar, multisequence MR images of both breasts were obtained
prior to and following the intravenous administration of 17 ml of
MultiHance.

[Series 2: STIR · axial · 3.0mm · 0.94mm/px · 1 of 62 slices shown]
[im 1/62]
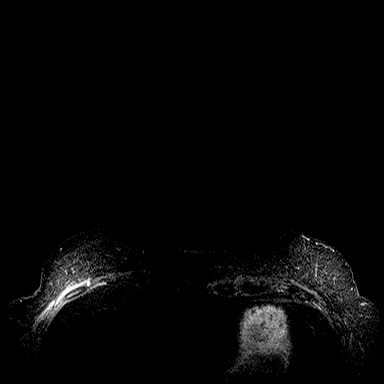

[Series 3: fl3d pre no · axial · non-contrast · 1.0mm · 0.80mm/px · z∈[-103,+104]mm · 5 of 208 slices shown]
[im 1/208]
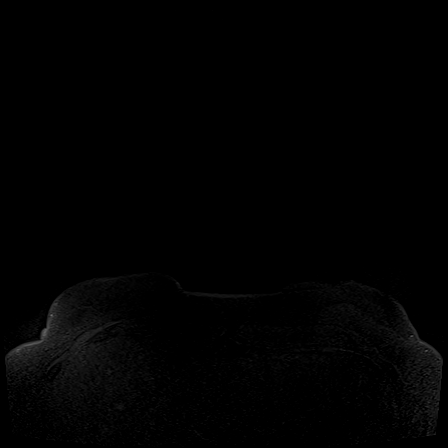
[im 52/208]
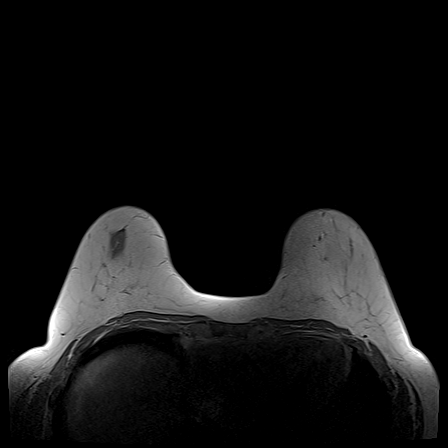
[im 104/208]
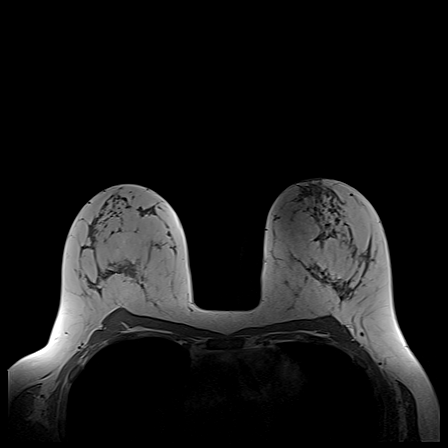
[im 156/208]
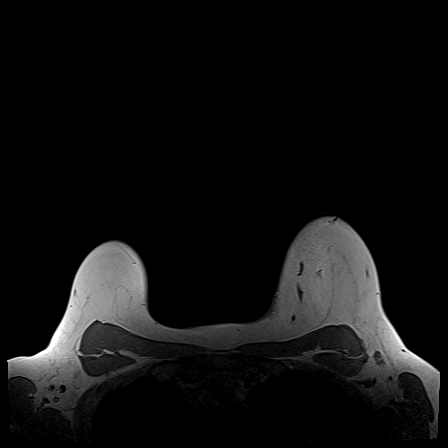
[im 208/208]
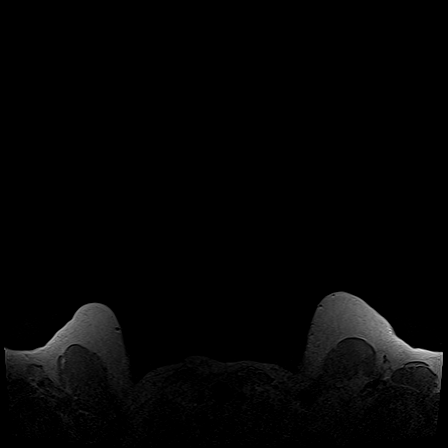

[Series 4: axial pre fs · axial · non-contrast · 1.0mm · 0.80mm/px · z∈[-103,+104]mm · 5 of 208 slices shown]
[im 1/208]
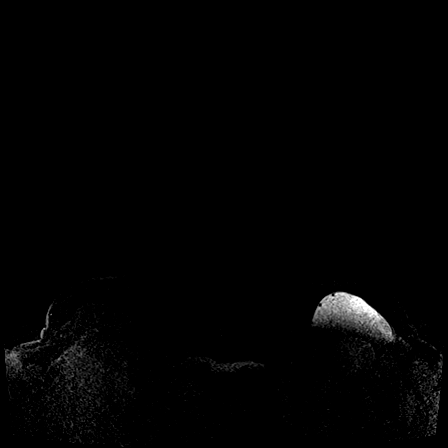
[im 52/208]
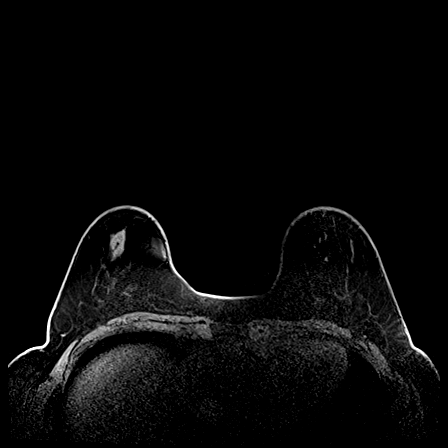
[im 104/208]
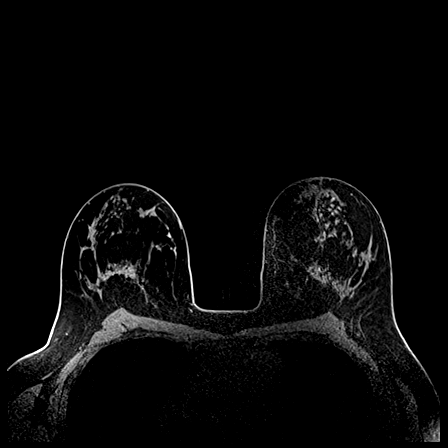
[im 156/208]
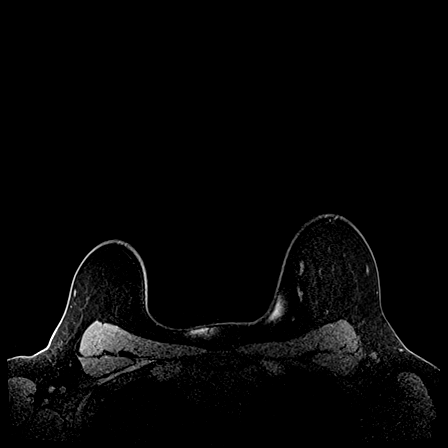
[im 208/208]
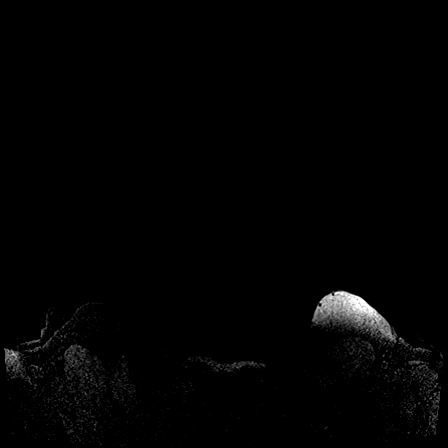

[Series 5: axial post 20 · axial · 1.0mm · 0.80mm/px · z∈[-103,+104]mm · 5 of 208 slices shown (1 of 3)]
[im 1/208]
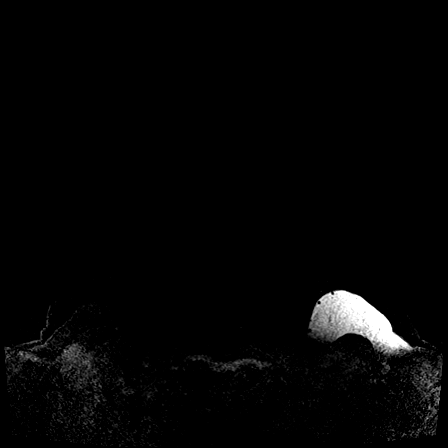
[im 52/208]
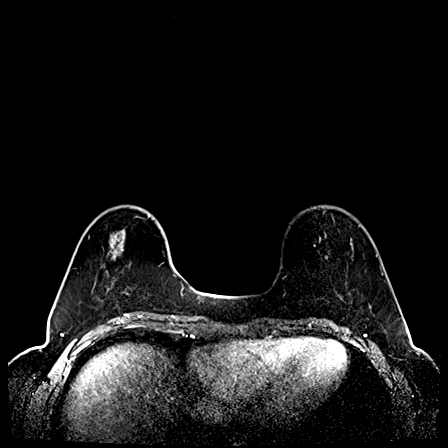
[im 104/208]
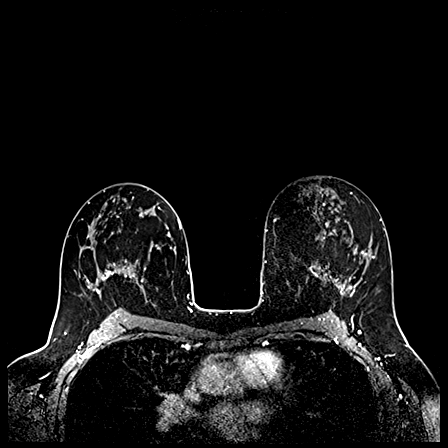
[im 156/208]
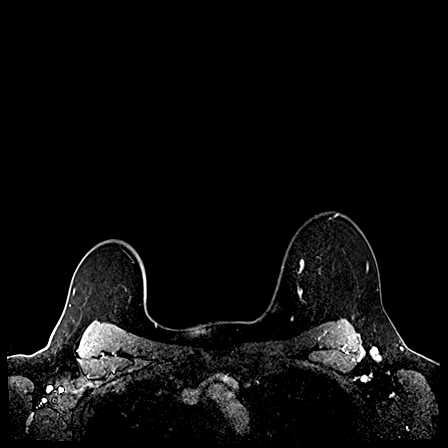
[im 208/208]
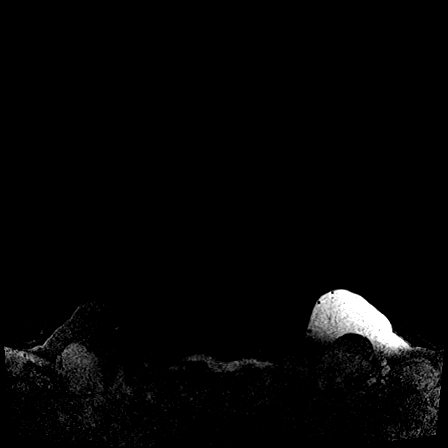

[Series 6: axial post 20 · axial · 1.0mm · 0.80mm/px · z∈[-103,+104]mm · 5 of 208 slices shown (2 of 3)]
[im 1/208]
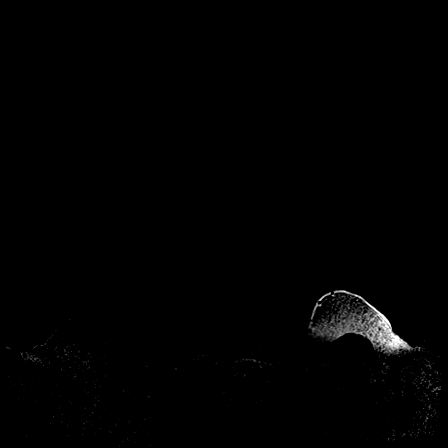
[im 52/208]
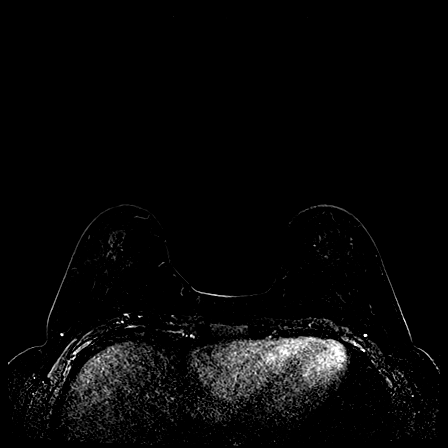
[im 104/208]
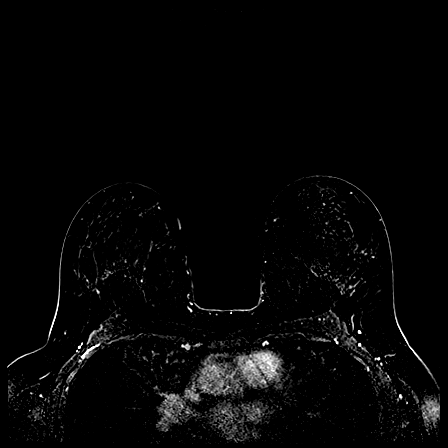
[im 156/208]
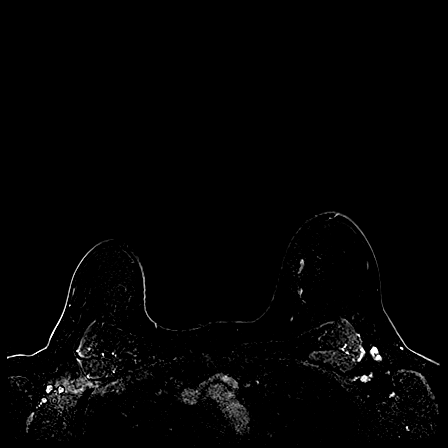
[im 208/208]
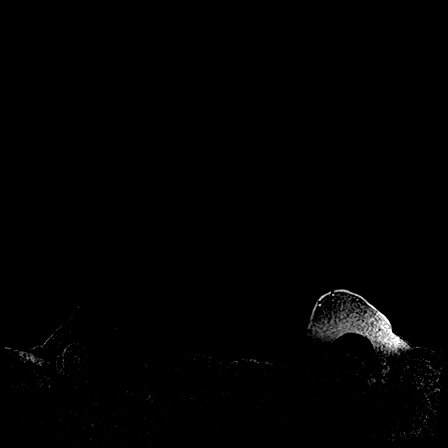

[Series 7: axial post 20 · axial · 208.0mm · 0.80mm/px · 1 of 1 slices shown (3 of 3)]
[im 1/1]
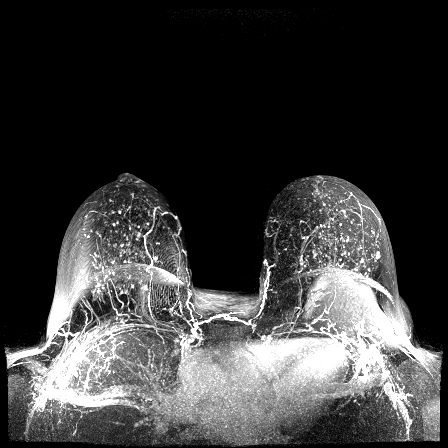

[Series 8: axial post 3 · axial · 1.0mm · 0.80mm/px · z∈[-103,-21]mm · 3 of 208 slices shown]
[im 1/208]
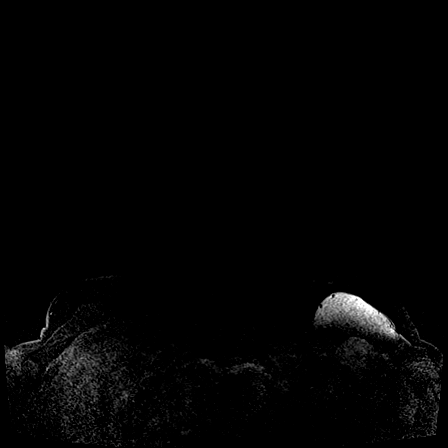
[im 42/208]
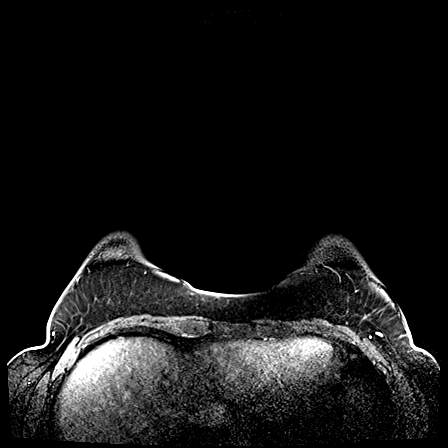
[im 83/208]
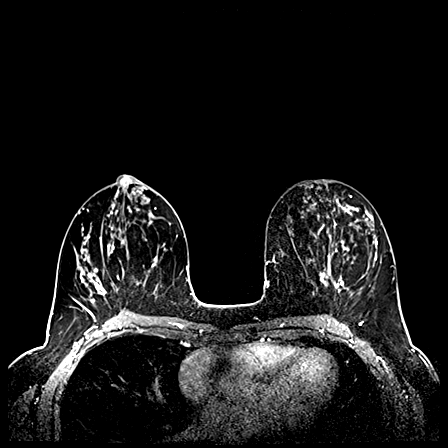

[25 of 48 positions shown; findings below may reference images not displayed]

THREE-DIMENSIONAL MR IMAGE RENDERING ON INDEPENDENT WORKSTATION:

Three-dimensional MR images were rendered by post-processing of the
original MR data on an independent workstation. The
three-dimensional MR images were interpreted, and findings are
reported in the following complete MRI report for this study. Three
dimensional images were evaluated at the independent [employer]
workstation
FINDINGS: Breast composition: c. Heterogeneous fibroglandular tissue.

Background parenchymal enhancement: There is marked, bilateral,
multinodular background parenchymal enhancement.

Right breast: There is an oval, slightly irregular mass located
within posterior [DATE] of the upper inner quadrant of the right breast
at the 11 o'clock position. This measures 8 x 5 x 5 mm in size. This
is associated with a mixture of plateau and washout enhancement
kinetics. Second-look ultrasound with possible ultrasound-guided
core biopsy of this area is recommended. If there is no ultrasound
correlate, MR guided biopsy is recommended. There are no additional
worrisome findings within the right breast.

Left breast: There is linear non mass enhancement within the
subareolar left breast measuring 11 x 7 x 7 mm in size. Second-look
ultrasound of this area with ultrasound-guided core biopsy is
recommended. If there is no ultrasound correlate, MR guided biopsy
of this area is recommended. There are no additional worrisome areas
of enhancement within the left breast.

Lymph nodes: No abnormal appearing lymph nodes.

Ancillary findings:  None.
IMPRESSION: 1. 8 mm oval, slightly irregular mass located within upper inner
quadrant of the right breast at the 11 o'clock position as discussed
above. Second-look ultrasound with possible ultrasound-guided core
biopsy is recommended. If there is no ultrasound correlate, MR
guided biopsy is recommended.
2. 1.1 cm area of linear non mass enhancement within the subareolar
left breast. Second-look ultrasound with possible ultrasound core
biopsy is recommended. If there no ultrasound correlate, MR guided
biopsy is recommended.

RECOMMENDATION:
Second-look ultrasound of the right and left breasts as discussed
above with ultrasound-guided core biopsies as discussed above. There
is no ultrasound correlate, MR guided biopsies are recommended as
discussed above.

BI-RADS CATEGORY  4: Suspicious.

## 2017-03-19 ENCOUNTER — Telehealth: Payer: Self-pay | Admitting: Family Medicine

## 2017-03-19 DIAGNOSIS — Z0000 Encounter for general adult medical examination without abnormal findings: Secondary | ICD-10-CM

## 2017-03-19 DIAGNOSIS — D509 Iron deficiency anemia, unspecified: Secondary | ICD-10-CM

## 2017-03-19 NOTE — Telephone Encounter (Signed)
-----   Message from Marchia Bond sent at 03/08/2017  2:55 PM EDT ----- Regarding: Cpx labs Tues 8/21, need orders. Thanks :-) Please order  future cpx labs for pt's upcoming lab appt. Thanks Aniceto Boss

## 2017-03-20 ENCOUNTER — Other Ambulatory Visit (INDEPENDENT_AMBULATORY_CARE_PROVIDER_SITE_OTHER): Payer: 59

## 2017-03-20 DIAGNOSIS — Z0000 Encounter for general adult medical examination without abnormal findings: Secondary | ICD-10-CM | POA: Diagnosis not present

## 2017-03-20 LAB — CBC WITH DIFFERENTIAL/PLATELET
BASOS ABS: 0 10*3/uL (ref 0.0–0.1)
BASOS PCT: 0.8 % (ref 0.0–3.0)
EOS ABS: 0.1 10*3/uL (ref 0.0–0.7)
Eosinophils Relative: 2.5 % (ref 0.0–5.0)
HCT: 40.3 % (ref 36.0–46.0)
HEMOGLOBIN: 13 g/dL (ref 12.0–15.0)
Lymphocytes Relative: 27.5 % (ref 12.0–46.0)
Lymphs Abs: 1.6 10*3/uL (ref 0.7–4.0)
MCHC: 32.2 g/dL (ref 30.0–36.0)
MCV: 88.5 fl (ref 78.0–100.0)
MONO ABS: 0.4 10*3/uL (ref 0.1–1.0)
Monocytes Relative: 6.5 % (ref 3.0–12.0)
Neutro Abs: 3.6 10*3/uL (ref 1.4–7.7)
Neutrophils Relative %: 62.7 % (ref 43.0–77.0)
PLATELETS: 234 10*3/uL (ref 150.0–400.0)
RBC: 4.55 Mil/uL (ref 3.87–5.11)
RDW: 13.4 % (ref 11.5–15.5)
WBC: 5.7 10*3/uL (ref 4.0–10.5)

## 2017-03-20 LAB — COMPREHENSIVE METABOLIC PANEL
ALBUMIN: 4 g/dL (ref 3.5–5.2)
ALT: 20 U/L (ref 0–35)
AST: 19 U/L (ref 0–37)
Alkaline Phosphatase: 53 U/L (ref 39–117)
BUN: 23 mg/dL (ref 6–23)
CHLORIDE: 106 meq/L (ref 96–112)
CO2: 29 meq/L (ref 19–32)
CREATININE: 0.83 mg/dL (ref 0.40–1.20)
Calcium: 9.4 mg/dL (ref 8.4–10.5)
GFR: 77.32 mL/min (ref >60.00)
Glucose, Bld: 98 mg/dL (ref 70–99)
Potassium: 4.4 mEq/L (ref 3.5–5.1)
SODIUM: 141 meq/L (ref 135–145)
Total Bilirubin: 0.3 mg/dL (ref 0.2–1.2)
Total Protein: 7 g/dL (ref 6.0–8.3)

## 2017-03-20 LAB — LIPID PANEL
CHOL/HDL RATIO: 3
Cholesterol: 133 mg/dL (ref 0–200)
HDL: 48.1 mg/dL (ref >39.00)
LDL CALC: 73 mg/dL (ref 0–99)
NonHDL: 84.81
Triglycerides: 57 mg/dL (ref 0.0–149.0)
VLDL: 11.4 mg/dL (ref 0.0–40.0)

## 2017-03-20 LAB — TSH: TSH: 2.34 u[IU]/mL (ref 0.35–4.50)

## 2017-03-23 ENCOUNTER — Ambulatory Visit (INDEPENDENT_AMBULATORY_CARE_PROVIDER_SITE_OTHER): Payer: 59 | Admitting: Family Medicine

## 2017-03-23 ENCOUNTER — Encounter: Payer: Self-pay | Admitting: Family Medicine

## 2017-03-23 VITALS — BP 90/60 | HR 71 | Temp 98.1°F | Resp 16 | Ht 64.5 in | Wt 158.0 lb

## 2017-03-23 DIAGNOSIS — Z0000 Encounter for general adult medical examination without abnormal findings: Secondary | ICD-10-CM | POA: Diagnosis not present

## 2017-03-23 DIAGNOSIS — Z800 Family history of malignant neoplasm of digestive organs: Secondary | ICD-10-CM

## 2017-03-23 DIAGNOSIS — Z114 Encounter for screening for human immunodeficiency virus [HIV]: Secondary | ICD-10-CM | POA: Diagnosis not present

## 2017-03-23 NOTE — Patient Instructions (Addendum)
It looks like you are due for a recall colonoscopy 2/19   Don't forget to schedule your mammogram for October    Labs look good   [place] work with diet/exercise and weight loss

## 2017-03-23 NOTE — Progress Notes (Signed)
Subjective:    Patient ID: Katie Espinoza, female    DOB: [birth year], 51 y.o.   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 Last 3 Encounters:  03/23/17 158 lb (71.7 kg)  06/02/16 182 lb 4 oz (82.7 kg)  05/01/16 180 lb 8 oz (81.9 kg)  she stopped eating sugar and bread -and lost weight  Exercise - plays with a puppy / running and training  26.70 kg/m  HIV screening -will do today   Colonoscopy 2/07-- recall 2/19   Flu shot - does not get   Mammogram 10/17-resulted in bx B9 Self breast exam -no new lumps and no nipple discharge   Pap 7/16-negative  Had a hysterectomy in 2016- for fibroids  No gyn issues    Td 6/11  Labs: Results for orders placed or performed in visit on 03/20/17  CBC with Differential/Platelet  Result Value Ref Range   WBC 5.7 4.0 - 10.5 K/uL   RBC 4.55 3.87 - 5.11 Mil/uL   Hemoglobin 13.0 12.0 - 15.0 g/dL   HCT 40.3 36.0 - 46.0 %   MCV 88.5 78.0 - 100.0 fl   MCHC 32.2 30.0 - 36.0 g/dL   RDW 13.4 11.5 - 15.5 %   Platelets 234.0 150.0 - 400.0 K/uL   Neutrophils Relative % 62.7 43.0 - 77.0 %   Lymphocytes Relative 27.5 12.0 - 46.0 %   Monocytes Relative 6.5 3.0 - 12.0 %   Eosinophils Relative 2.5 0.0 - 5.0 %   Basophils Relative 0.8 0.0 - 3.0 %   Neutro Abs 3.6 1.4 - 7.7 K/uL   Lymphs Abs 1.6 0.7 - 4.0 K/uL   Monocytes Absolute 0.4 0.1 - 1.0 K/uL   Eosinophils Absolute 0.1 0.0 - 0.7 K/uL   Basophils Absolute 0.0 0.0 - 0.1 K/uL  Comprehensive metabolic panel  Result Value Ref Range   Sodium 141 135 - 145 mEq/L   Potassium 4.4 3.5 - 5.1 mEq/L   Chloride 106 96 - 112 mEq/L   CO2 29 19 - 32 mEq/L   Glucose, Bld 98 70 - 99 mg/dL   BUN 23 6 - 23 mg/dL   Creatinine, Ser 0.83 0.40 - 1.20 mg/dL   Total Bilirubin 0.3 0.2 - 1.2 mg/dL   Alkaline Phosphatase 53 39 - 117 U/L   AST 19 0 - 37 U/L   ALT 20 0 - 35 U/L   Total Protein 7.0 6.0 -  8.3 g/dL   Albumin 4.0 3.5 - 5.2 g/dL   Calcium 9.4 8.4 - 10.5 mg/dL   GFR 77.32 >60.00 mL/min  Lipid panel  Result Value Ref Range   Cholesterol 133 0 - 200 mg/dL   Triglycerides 57.0 0.0 - 149.0 mg/dL   HDL 48.10 >39.00 mg/dL   VLDL 11.4 0.0 - 40.0 mg/dL   LDL Cholesterol 73 0 - 99 mg/dL   Total CHOL/HDL Ratio 3    NonHDL 84.81   TSH  Result Value Ref Range   TSH 2.34 0.35 - 4.50 uIU/mL    Labs look very good   Patient Active Problem List   Diagnosis Date Noted  . Encounter for screening for HIV 03/23/2017  . Discharge from left nipple 05/01/2016  . S/P hysterectomy 05/04/2015  . Oral aphthous ulcer 07/02/2012  . Other screening mammogram 05/17/2012  . Anemia, iron deficiency 05/08/2011  .  Family history of colon cancer 05/08/2011  . Encounter for routine gynecological examination 05/08/2011  . Routine general medical examination at a health care facility 05/01/2011  . 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 MIGRAINE HEADACHE 02/14/2007  . RHINITIS, ALLERGIC NEC 02/14/2007   Past Medical History:  Diagnosis Date  . Chronic constipation   . Heavy menstrual bleeding   . Iron deficiency anemia    IV Iron infusions--  last infusion 09-23-22016  . Migraine   . Uterine fibroid   . Wears glasses    Past Surgical History:  Procedure Laterality Date  . BILATERAL SALPINGECTOMY Bilateral 05/04/2015   Procedure: BILATERAL SALPINGECTOMY;  Surgeon: Linda Hedges, DO;  Location: [place];  Service: Gynecology;  Laterality: Bilateral;  . CYSTOSCOPY N/A 05/04/2015   Procedure: CYSTOSCOPY;  Surgeon: Linda Hedges, DO;  Location: [facility];  Service: Gynecology;  Laterality: N/A;  . DILATATION & CURETTAGE/HYSTEROSCOPY WITH TRUECLEAR N/A 10/22/2013   Procedure: DILATATION & CURETTAGE/HYSTEROSCOPY WITH TRUCLEAR;  Surgeon: Linda Hedges, DO;  Location: [facility];  Service: Gynecology;  Laterality: N/A;  . LAPAROSCOPIC ASSISTED VAGINAL HYSTERECTOMY N/A 05/04/2015    Procedure: LAPAROSCOPIC ASSISTED VAGINAL HYSTERECTOMY;  Surgeon: Linda Hedges, DO;  Location: [place];  Service: Gynecology;  Laterality: N/A;  need bed   Social History  Substance Use Topics  . Smoking status: Former Smoker    Years: 2.00    Types: Cigarettes    Quit date: 07/31/1990  . Smokeless tobacco: Never Used  . Alcohol use 0.0 oz/week     Comment: rare   Family History  Problem Relation Age of Onset  . Cancer Father        colon  . Hyperlipidemia Mother    Allergies  Allergen Reactions  . Erythromycin Rash  . Penicillins Rash   Current Outpatient Prescriptions on File Prior to Visit  Medication Sig Dispense Refill  . BIOTIN PO Take 1 tablet by mouth daily.    . bisacodyl (DULCOLAX) 5 MG EC tablet Take 5 mg by mouth daily as needed for moderate constipation.    . docusate sodium (COLACE) 100 MG capsule Take 1 capsule (100 mg total) by mouth 2 (two) times daily. 60 capsule 1  . ibuprofen (ADVIL,MOTRIN) 800 MG tablet Take 1 tablet (800 mg total) by mouth every 8 (eight) hours as needed. 30 tablet 0  . Multiple Vitamins-Minerals (CENTRUM ADULTS) TABS Take 1 tablet by mouth daily.     No current facility-administered medications on file prior to visit.     Review of Systems Review of Systems  Constitutional: Negative for fever, appetite change, fatigue and unexpected weight change.  Eyes: Negative for pain and visual disturbance.  Respiratory: Negative for cough and shortness of breath.   Cardiovascular: Negative for cp or palpitations    Gastrointestinal: Negative for nausea, diarrhea and constipation.  Genitourinary: Negative for urgency and frequency.  Skin: Negative for pallor or rash   Neurological: Negative for weakness, light-headedness, numbness and headaches.  Hematological: Negative for adenopathy. Does not bruise/bleed easily.  Psychiatric/Behavioral: Negative for dysphoric mood. The patient is not nervous/anxious.         Objective:    Physical Exam  Constitutional: She appears well-developed and well-nourished. No distress.  overwt and well appearing   HENT:  Head: Normocephalic and atraumatic.  Right Ear: External ear normal.  Left Ear: External ear normal.  Mouth/Throat: Oropharynx is clear and moist.  Eyes: Pupils are equal, round, and reactive to light. Conjunctivae and EOM  are normal. No scleral icterus.  Neck: Normal range of motion. Neck supple. No JVD present. Carotid bruit is not present. No thyromegaly present.  Cardiovascular: Normal rate, regular rhythm, normal heart sounds and intact distal pulses.  Exam reveals no gallop.   Pulmonary/Chest: Effort normal and breath sounds normal. No respiratory distress. She has no wheezes. She exhibits no tenderness.  Abdominal: Soft. Bowel sounds are normal. She exhibits no distension, no abdominal bruit and no mass. There is no tenderness.  Genitourinary: No breast swelling, tenderness, discharge or bleeding.  Genitourinary Comments: Breast exam: No mass, nodules, thickening, tenderness, bulging, retraction, inflamation, nipple discharge or skin changes noted.  No axillary or clavicular LA.      Musculoskeletal: Normal range of motion. She exhibits no edema or tenderness.  Lymphadenopathy:    She has no cervical adenopathy.  Neurological: She is alert. She has normal reflexes. No cranial nerve deficit. She exhibits normal muscle tone. Coordination normal.  Skin: Skin is warm and dry. No rash noted. No erythema. No pallor.  Solar lentigines diffusely   Psychiatric: She has a normal mood and affect.          Assessment & Plan:   Problem List Items Addressed This Visit      Other   Encounter for screening for HIV   Relevant Orders   HIV antibody   Family history of colon cancer    Colonoscopy due 2/19  Pt aware       Routine general medical examination at a health care facility - Primary    Reviewed health habits including diet and exercise and skin cancer  prevention Reviewed appropriate screening tests for age  Also reviewed health mt list, fam hx and immunization status , as well as social and family history   See HPI Labs rev  Will plan on HIV screen next draw (not high risk) Mammogram planned for oct  Colonoscopy due 2/19- pt aware

## 2017-03-25 NOTE — Assessment & Plan Note (Signed)
Reviewed health habits including diet and exercise and skin cancer prevention Reviewed appropriate screening tests for age  Also reviewed health mt list, fam hx and immunization status , as well as social and family history   See HPI Labs rev  Will plan on HIV screen next draw (not high risk) Mammogram planned for oct  Colonoscopy due 2/19- pt aware

## 2017-03-25 NOTE — Assessment & Plan Note (Signed)
Colonoscopy due 2/19  Pt aware

## 2017-03-28 ENCOUNTER — Encounter: Payer: Self-pay | Admitting: Family Medicine

## 2017-03-29 ENCOUNTER — Encounter: Payer: Self-pay | Admitting: *Deleted

## 2017-09-12 ENCOUNTER — Encounter: Payer: Self-pay | Admitting: Gastroenterology

## 2017-09-19 ENCOUNTER — Other Ambulatory Visit: Payer: Self-pay | Admitting: Surgery

## 2017-09-19 DIAGNOSIS — N6310 Unspecified lump in the right breast, unspecified quadrant: Secondary | ICD-10-CM

## 2017-10-12 ENCOUNTER — Other Ambulatory Visit: Payer: Self-pay

## 2017-10-12 ENCOUNTER — Ambulatory Visit (AMBULATORY_SURGERY_CENTER): Payer: Self-pay | Admitting: *Deleted

## 2017-10-12 VITALS — Ht 65.0 in | Wt 166.0 lb

## 2017-10-12 DIAGNOSIS — Z1211 Encounter for screening for malignant neoplasm of colon: Secondary | ICD-10-CM

## 2017-10-12 MED ORDER — NA SULFATE-K SULFATE-MG SULF 17.5-3.13-1.6 GM/177ML PO SOLN: ORAL | 0 refills | Status: DC

## 2017-10-12 NOTE — Progress Notes (Signed)
Patient denies any allergies to eggs or soy. Patient denies any problems with anesthesia/sedation. Patient denies any oxygen use at home. Patient denies taking any diet/weight loss medications or blood thinners. EMMI education assisgned to patient on colonoscopy, this was explained and instructions given to patient. 2 day prep given to pt due to c/o constipation per pt.

## 2017-10-18 ENCOUNTER — Encounter: Payer: Self-pay | Admitting: Primary Care

## 2017-10-18 ENCOUNTER — Ambulatory Visit: Payer: 59 | Admitting: Primary Care

## 2017-10-18 ENCOUNTER — Ambulatory Visit: Payer: 59 | Admitting: Internal Medicine

## 2017-10-18 VITALS — BP 100/62 | HR 64 | Temp 98.2°F | Ht 65.0 in | Wt 170.8 lb

## 2017-10-18 DIAGNOSIS — Z20828 Contact with and (suspected) exposure to other viral communicable diseases: Secondary | ICD-10-CM

## 2017-10-18 LAB — POC INFLUENZA A&B (BINAX/QUICKVUE)
INFLUENZA A, POC: NEGATIVE
INFLUENZA B, POC: NEGATIVE

## 2017-10-18 MED ORDER — OSELTAMIVIR PHOSPHATE 75 MG PO CAPS: 75.0000 mg | ORAL_CAPSULE | Freq: Every day | ORAL | 0 refills | Status: DC

## 2017-10-18 NOTE — Progress Notes (Signed)
Subjective:    Patient ID: Katie Espinoza, female    DOB: 1966-07-01, 52 y.o.   MRN: [id number]  HPI  Katie Espinoza is a 52 year old female with a history of allergic rhinitis who presents today with a chief complaint of scratchy throat and sinus pressure.   Her symptoms began yesterday and are overall mild. Both her husband and daughter were diagnosed with influenza yesterday, she's feeling nervous that she may contract influenza. She denies fevers, cough, chills, body aches.   Review of Systems  Constitutional: Negative for chills, fatigue and fever.  HENT: Positive for sinus pressure.        Scratchy throat  Respiratory: Negative for shortness of breath and wheezing.   Musculoskeletal: Negative for myalgias.       Past Medical History:  Diagnosis Date  . Chronic constipation   . Heavy menstrual bleeding   . Iron deficiency anemia    IV Iron infusions--  last infusion 09-23-22016  . Migraine   . Uterine fibroid   . Wears glasses      Social History   Socioeconomic History  . Marital status: Married    Spouse name: Not on file  . Number of children: 2  . Years of education: Not on file  . Highest education level: Not on file  Occupational History  . Occupation: Garment/textile technologist: [employer]  . Financial resource strain: Not on file  . Food insecurity:    Worry: Not on file    Inability: Not on file  . Transportation needs:    Medical: Not on file    Non-medical: Not on file  Tobacco Use  . Smoking status: Former Smoker    Years: 2.00    Types: Cigarettes    Last attempt to quit: 07/31/1990    Years since quitting: 27.2  . Smokeless tobacco: Never Used  Substance and Sexual Activity  . Alcohol use: Yes    Alcohol/week: 0.0 oz    Comment: rare  . Drug use: No  . Sexual activity: Not on file  Lifestyle  . Physical activity:    Days per week: Not on file    Minutes per session: Not on file  . Stress: Not on file    Relationships  . Social connections:    Talks on phone: Not on file    Gets together: Not on file    Attends religious service: Not on file    Active member of club or organization: Not on file    Attends meetings of clubs or organizations: Not on file    Relationship status: Not on file  . Intimate partner violence:    Fear of current or ex partner: Not on file    Emotionally abused: Not on file    Physically abused: Not on file    Forced sexual activity: Not on file  Other Topics Concern  . Not on file  Social History Narrative  . Not on file    Past Surgical History:  Procedure Laterality Date  . BILATERAL SALPINGECTOMY Bilateral 05/04/2015   Procedure: BILATERAL SALPINGECTOMY;  Surgeon: Linda Hedges, DO;  Location: [facility];  Service: Gynecology;  Laterality: Bilateral;  . BREAST BIOPSY    . COLONOSCOPY  2007  . CYSTOSCOPY N/A 05/04/2015   Procedure: CYSTOSCOPY;  Surgeon: Linda Hedges, DO;  Location: [facility];  Service: Gynecology;  Laterality: N/A;  . DILATATION & CURETTAGE/HYSTEROSCOPY WITH TRUECLEAR  N/A 10/22/2013   Procedure: DILATATION & CURETTAGE/HYSTEROSCOPY WITH TRUCLEAR;  Surgeon: Linda Hedges, DO;  Location: [facility];  Service: Gynecology;  Laterality: N/A;  . LAPAROSCOPIC ASSISTED VAGINAL HYSTERECTOMY N/A 05/04/2015   Procedure: LAPAROSCOPIC ASSISTED VAGINAL HYSTERECTOMY;  Surgeon: Linda Hedges, DO;  Location: [facility];  Service: Gynecology;  Laterality: N/A;  need bed    Family History  Problem Relation Age of Onset  . Cancer Father        colon  . Colon cancer Father 72  . Hyperlipidemia Mother   . Colon cancer Paternal Uncle 45    Allergies  Allergen Reactions  . Erythromycin Rash  . Penicillins Rash    Current Outpatient Medications on File Prior to Visit  Medication Sig Dispense Refill  . docusate sodium (COLACE) 100 MG capsule Take 1 capsule (100 mg total) by mouth 2 (two) times daily. 60 capsule  1  . ibuprofen (ADVIL,MOTRIN) 800 MG tablet Take 1 tablet (800 mg total) by mouth every 8 (eight) hours as needed. 30 tablet 0  . Na Sulfate-K Sulfate-Mg Sulf 17.5-3.13-1.6 GM/177ML SOLN Suprep (no substitutions)-TAKE AS DIRECTED. (Patient not taking: Reported on 10/18/2017) 354 mL 0   No current facility-administered medications on file prior to visit.     BP 100/62   Pulse 64   Temp 98.2 F (36.8 C) (Oral)   Ht 5\' 5"  (1.651 m)   Wt 170 lb 12 oz (77.5 kg)   LMP 04/26/2015   SpO2 98%   BMI 28.41 kg/m    Objective:   Physical Exam  Constitutional: She appears well-nourished.  HENT:  Right Ear: Tympanic membrane and ear canal normal.  Left Ear: Tympanic membrane and ear canal normal.  Nose: No mucosal edema. Right sinus exhibits maxillary sinus tenderness. Right sinus exhibits no frontal sinus tenderness. Left sinus exhibits maxillary sinus tenderness. Left sinus exhibits no frontal sinus tenderness.  Mouth/Throat: Oropharynx is clear and moist.  Eyes: Conjunctivae are normal.  Neck: Neck supple.  Cardiovascular: Normal rate and regular rhythm.  Pulmonary/Chest: Effort normal and breath sounds normal. She has no wheezes. She has no rales.  Lymphadenopathy:    She has no cervical adenopathy.  Skin: Skin is warm and dry.          Assessment & Plan:  Exposure to Influenza:   Exposed to influenza from husband and daughter who were diagnosed yesterday. Symptoms today not representative of influenza, exam unremarkable. Rapid Flu: Negative Given exposure, will treat with prophylactic dose of Tamiflu.  Rx for Tamiflu 75 mg sent to pharmacy x 7 day course. Discussed prevention at home.   Pleas Koch, NP

## 2017-10-18 NOTE — Addendum Note (Signed)
Addended by: Jacqualin Combes on: 10/18/2017 08:47 AM   Modules accepted: Orders

## 2017-10-18 NOTE — Patient Instructions (Signed)
You tested negative for influenza.  Start Tamiflu 75 mg capsules for influenza prevention. Take 1 capsule by mouth once daily for 7 days.  Be sure to stay hydrated with water, wipe down all common areas, take Tylenol or Ibuprofen if you develop fevers/body aches.   It was a pleasure meeting you!

## 2017-10-19 ENCOUNTER — Encounter: Payer: Self-pay | Admitting: Gastroenterology

## 2017-11-02 ENCOUNTER — Encounter: Payer: Self-pay | Admitting: Gastroenterology

## 2017-11-02 ENCOUNTER — Ambulatory Visit (AMBULATORY_SURGERY_CENTER): Payer: 59 | Admitting: Gastroenterology

## 2017-11-02 ENCOUNTER — Other Ambulatory Visit: Payer: Self-pay

## 2017-11-02 VITALS — BP 100/63 | HR 66 | Temp 97.3°F | Resp 10

## 2017-11-02 DIAGNOSIS — Z1211 Encounter for screening for malignant neoplasm of colon: Secondary | ICD-10-CM | POA: Diagnosis present

## 2017-11-02 DIAGNOSIS — Z86010 Personal history of colonic polyps: Secondary | ICD-10-CM | POA: Diagnosis not present

## 2017-11-02 DIAGNOSIS — Z800 Family history of malignant neoplasm of digestive organs: Secondary | ICD-10-CM | POA: Diagnosis not present

## 2017-11-02 DIAGNOSIS — D123 Benign neoplasm of transverse colon: Secondary | ICD-10-CM

## 2017-11-02 MED ORDER — SODIUM CHLORIDE 0.9 % IV SOLN: 500.0000 mL | Freq: Once | INTRAVENOUS | Status: DC

## 2017-11-02 NOTE — Progress Notes (Signed)
Report given to PACU, vss 

## 2017-11-02 NOTE — Progress Notes (Signed)
Called to room to assist during endoscopic procedure.  Patient ID and intended procedure confirmed with present staff. Received instructions for my participation in the procedure from the performing physician.  

## 2017-11-02 NOTE — Patient Instructions (Signed)
Impression/Recommendations:  Polyp handout given to patient. Diverticulosis handout given to patient. Hemorrhoid handout given to patient.  Resume previous diet. Continue present medications.  Repeat colonoscopy in 5 years for surveillance.  YOU HAD AN ENDOSCOPIC PROCEDURE TODAY AT [facility]:   Refer to the procedure report that was given to you for any specific questions about what was found during the examination.  If the procedure report does not answer your questions, please call your gastroenterologist to clarify.  If you requested that your care partner not be given the details of your procedure findings, then the procedure report has been included in a sealed envelope for you to review at your convenience later.  YOU SHOULD EXPECT: Some feelings of bloating in the abdomen. Passage of more gas than usual.  Walking can help get rid of the air that was put into your GI tract during the procedure and reduce the bloating. If you had a lower endoscopy (such as a colonoscopy or flexible sigmoidoscopy) you may notice spotting of blood in your stool or on the toilet paper. If you underwent a bowel prep for your procedure, you may not have a normal bowel movement for a few days.  Please Note:  You might notice some irritation and congestion in your nose or some drainage.  This is from the oxygen used during your procedure.  There is no need for concern and it should clear up in a day or so.  SYMPTOMS TO REPORT IMMEDIATELY:   Following lower endoscopy (colonoscopy or flexible sigmoidoscopy):  Excessive amounts of blood in the stool  Significant tenderness or worsening of abdominal pains  Swelling of the abdomen that is new, acute  Fever of 100F or higher  For urgent or emergent issues, a gastroenterologist can be reached at any hour by calling [phone].   DIET:  We do recommend a small meal at first, but then you may proceed to your regular diet.  Drink plenty of  fluids but you should avoid alcoholic beverages for 24 hours.  ACTIVITY:  You should plan to take it easy for the rest of today and you should NOT DRIVE or use heavy machinery until tomorrow (because of the sedation medicines used during the test).    FOLLOW UP: Our staff will call the number listed on your records the next business day following your procedure to check on you and address any questions or concerns that you may have regarding the information given to you following your procedure. If we do not reach you, we will leave a message.  However, if you are feeling well and you are not experiencing any problems, there is no need to return our call.  We will assume that you have returned to your regular daily activities without incident.  If any biopsies were taken you will be contacted by phone or by letter within the next 1-3 weeks.  Please call us at [phone] if you have not heard about the biopsies in 3 weeks.    SIGNATURES/CONFIDENTIALITY: You and/or your care partner have signed paperwork which will be entered into your electronic medical record.  These signatures attest to the fact that that the information above on your After Visit Summary has been reviewed and is understood.  Full responsibility of the confidentiality of this discharge information lies with you and/or your care-partner.

## 2017-11-02 NOTE — Progress Notes (Signed)
No changes in medical or surgical hx since PV per pt 

## 2017-11-02 NOTE — Op Note (Addendum)
[facility] Patient Name: Katie Espinoza Procedure Date: 11/02/2017 7:53 AM MRN: [id number] Endoscopist: Mauri Pole , MD Age: 52 Referring MD:  Date of Birth: [birth year] Gender: Female Account #: [id number] Procedure:                Colonoscopy Indications:              Screening patient at increased risk: Family history                            of colorectal cancer in multiple relatives                            (biologic father and his brother at age <76) Medicines:                Monitored Anesthesia Care Procedure:                Pre-Anesthesia Assessment:                           - Prior to the procedure, a History and Physical                            was performed, and patient medications and                            allergies were reviewed. The patient's tolerance of                            previous anesthesia was also reviewed. The risks                            and benefits of the procedure and the sedation                            options and risks were discussed with the patient.                            All questions were answered, and informed consent                            was obtained. Prior Anticoagulants: The patient has                            taken no previous anticoagulant or antiplatelet                            agents. ASA Grade Assessment: II - A patient with                            mild systemic disease. After reviewing the risks                            and benefits, the patient was deemed in  satisfactory condition to undergo the procedure.                           After obtaining informed consent, the colonoscope                            was passed under direct vision. Throughout the                            procedure, the patient's blood pressure, pulse, and                            oxygen saturations were monitored continuously. The                            Colonoscope was  introduced through the anus and                            advanced to the the cecum, identified by                            appendiceal orifice and ileocecal valve. The                            colonoscopy was performed without difficulty. The                            patient tolerated the procedure well. The quality                            of the bowel preparation was adequate. The                            ileocecal valve, appendiceal orifice, and rectum                            were photographed. Scope In: 8:19:58 AM Scope Out: 8:40:37 AM Scope Withdrawal Time: 0 hours 15 minutes 2 seconds  Total Procedure Duration: 0 hours 20 minutes 39 seconds  Findings:                 The perianal and digital rectal examinations were                            normal.                           A 5 mm polyp was found in the hepatic flexure. The                            polyp was sessile. The polyp was removed with a                            cold snare. Resection and retrieval were complete.  Scattered small-mouthed diverticula were found in                            the sigmoid colon and descending colon.                           Non-bleeding internal hemorrhoids were found during                            retroflexion. The hemorrhoids were small.                           The exam was otherwise without abnormality. Complications:            No immediate complications. Estimated Blood Loss:     Estimated blood loss was minimal. Impression:               - One 5 mm polyp at the hepatic flexure, removed                            with a cold snare. Resected and retrieved.                           - Mild diverticulosis in the sigmoid colon and in                            the descending colon.                           - Non-bleeding internal hemorrhoids.                           - The examination was otherwise normal. Recommendation:           -  Patient has a contact number available for                            emergencies. The signs and symptoms of potential                            delayed complications were discussed with the                            patient. Return to normal activities tomorrow.                            Written discharge instructions were provided to the                            patient.                           - Resume previous diet.                           - Continue present medications.                           -  Await pathology results.                           - Repeat colonoscopy in 5 years for surveillance                            based on pathology results. Mauri Pole, MD 11/02/2017 8:44:31 AM This report has been signed electronically.

## 2017-11-05 ENCOUNTER — Telehealth: Payer: Self-pay

## 2017-11-05 NOTE — Telephone Encounter (Signed)
  Follow up Call-  Call back number 11/02/2017  Post procedure Call Back phone  # [phone] husband's cell  Permission to leave phone message Yes  Some recent data might be hidden     Patient questions:  Do you have a fever, pain , or abdominal swelling? No. Pain Score  0 *  Have you tolerated food without any problems? Yes.    Have you been able to return to your normal activities? Yes.   Gone to work this am Do you have any questions about your discharge instructions: Diet   No. Medications  No. Follow up visit  No.  Do you have questions or concerns about your Care? No.  Actions: * If pain score is 4 or above: No action needed, pain <4.

## 2017-11-08 ENCOUNTER — Encounter: Payer: Self-pay | Admitting: Gastroenterology

## 2018-01-04 DIAGNOSIS — L718 Other rosacea: Secondary | ICD-10-CM | POA: Diagnosis not present

## 2018-01-04 DIAGNOSIS — D2261 Melanocytic nevi of right upper limb, including shoulder: Secondary | ICD-10-CM | POA: Diagnosis not present

## 2018-01-04 DIAGNOSIS — D2262 Melanocytic nevi of left upper limb, including shoulder: Secondary | ICD-10-CM | POA: Diagnosis not present

## 2018-03-20 ENCOUNTER — Telehealth: Payer: Self-pay | Admitting: Family Medicine

## 2018-03-20 DIAGNOSIS — Z114 Encounter for screening for human immunodeficiency virus [HIV]: Secondary | ICD-10-CM

## 2018-03-20 DIAGNOSIS — Z0000 Encounter for general adult medical examination without abnormal findings: Secondary | ICD-10-CM

## 2018-03-20 NOTE — Telephone Encounter (Signed)
-----   Message from Lendon Collar, RT sent at 03/13/2018 10:32 AM EDT ----- Regarding: Lab orders for Friday 8/23 Please enter CPE lab orders for 03/22/18. Thanks-Lauren

## 2018-03-22 ENCOUNTER — Other Ambulatory Visit (INDEPENDENT_AMBULATORY_CARE_PROVIDER_SITE_OTHER): Payer: 59

## 2018-03-22 DIAGNOSIS — Z0000 Encounter for general adult medical examination without abnormal findings: Secondary | ICD-10-CM | POA: Diagnosis not present

## 2018-03-22 DIAGNOSIS — Z114 Encounter for screening for human immunodeficiency virus [HIV]: Secondary | ICD-10-CM

## 2018-03-22 LAB — COMPREHENSIVE METABOLIC PANEL
ALT: 19 U/L (ref 0–35)
AST: 15 U/L (ref 0–37)
Albumin: 4.5 g/dL (ref 3.5–5.2)
Alkaline Phosphatase: 50 U/L (ref 39–117)
BUN: 24 mg/dL — AB (ref 6–23)
CO2: 30 meq/L (ref 19–32)
Calcium: 10.1 mg/dL (ref 8.4–10.5)
Chloride: 103 mEq/L (ref 96–112)
Creatinine, Ser: 0.97 mg/dL (ref 0.40–1.20)
GFR: 64.33 mL/min (ref >60.00)
GLUCOSE: 106 mg/dL — AB (ref 70–99)
Potassium: 4.5 mEq/L (ref 3.5–5.1)
SODIUM: 140 meq/L (ref 135–145)
Total Bilirubin: 0.6 mg/dL (ref 0.2–1.2)
Total Protein: 7.3 g/dL (ref 6.0–8.3)

## 2018-03-22 LAB — CBC WITH DIFFERENTIAL/PLATELET
BASOS ABS: 0 10*3/uL (ref 0.0–0.1)
Basophils Relative: 0.7 % (ref 0.0–3.0)
EOS ABS: 0.1 10*3/uL (ref 0.0–0.7)
Eosinophils Relative: 2 % (ref 0.0–5.0)
HCT: 40.8 % (ref 36.0–46.0)
Hemoglobin: 13.6 g/dL (ref 12.0–15.0)
Lymphocytes Relative: 28.5 % (ref 12.0–46.0)
Lymphs Abs: 1.5 10*3/uL (ref 0.7–4.0)
MCHC: 33.2 g/dL (ref 30.0–36.0)
MCV: 87.1 fl (ref 78.0–100.0)
MONO ABS: 0.4 10*3/uL (ref 0.1–1.0)
MONOS PCT: 8 % (ref 3.0–12.0)
NEUTROS ABS: 3.2 10*3/uL (ref 1.4–7.7)
NEUTROS PCT: 60.8 % (ref 43.0–77.0)
PLATELETS: 239 10*3/uL (ref 150.0–400.0)
RBC: 4.69 Mil/uL (ref 3.87–5.11)
RDW: 13.2 % (ref 11.5–15.5)
WBC: 5.2 10*3/uL (ref 4.0–10.5)

## 2018-03-22 LAB — TSH: TSH: 2.52 u[IU]/mL (ref 0.35–4.50)

## 2018-03-22 LAB — LIPID PANEL
Cholesterol: 176 mg/dL (ref 0–200)
HDL: 60.7 mg/dL (ref >39.00)
LDL CALC: 101 mg/dL — AB (ref 0–99)
NONHDL: 115.1
TRIGLYCERIDES: 72 mg/dL (ref 0.0–149.0)
Total CHOL/HDL Ratio: 3
VLDL: 14.4 mg/dL (ref 0.0–40.0)

## 2018-03-23 LAB — HIV ANTIBODY (ROUTINE TESTING W REFLEX): HIV 1&2 Ab, 4th Generation: NONREACTIVE

## 2018-03-29 ENCOUNTER — Encounter: Payer: Self-pay | Admitting: Family Medicine

## 2018-03-29 ENCOUNTER — Ambulatory Visit (INDEPENDENT_AMBULATORY_CARE_PROVIDER_SITE_OTHER): Payer: 59 | Admitting: Family Medicine

## 2018-03-29 VITALS — BP 108/74 | HR 67 | Temp 98.3°F | Ht 64.75 in | Wt 167.5 lb

## 2018-03-29 DIAGNOSIS — R739 Hyperglycemia, unspecified: Secondary | ICD-10-CM | POA: Diagnosis not present

## 2018-03-29 DIAGNOSIS — Z0000 Encounter for general adult medical examination without abnormal findings: Secondary | ICD-10-CM

## 2018-03-29 DIAGNOSIS — N6452 Nipple discharge: Secondary | ICD-10-CM

## 2018-03-29 DIAGNOSIS — Z800 Family history of malignant neoplasm of digestive organs: Secondary | ICD-10-CM

## 2018-03-29 DIAGNOSIS — R7303 Prediabetes: Secondary | ICD-10-CM | POA: Insufficient documentation

## 2018-03-29 NOTE — Progress Notes (Signed)
Subjective:    Patient ID: Katie Espinoza, female    DOB: 1966/02/10, 52 y.o.   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  It is fun   Wt Readings from Last 3 Encounters:  03/29/18 167 lb 8 oz (76 kg)  10/18/17 170 lb 12 oz (77.5 kg)  10/12/17 166 lb (75.3 kg)  taking care of herself  Getting exercise  Is mindful about diet -fair amt of cheating this summer  28.09 kg/m   Mammogram 10/17 (B9 bx)- called to schedule her mammogram and they said she needs MRI  That needs to come from the surgeon to set that up  Self breast exam -no lumps or changes/still has inverted nipple and nipple d/c on L   Pap 7/16-normal/ Dr Lynnette Caffey   Hysterectomy 2016 for fibroids Has not been back  No problems   Flu shot - usually does not get   Tetanus shot 6/11  Colonoscopy 4/19 - polyp/ 5 y recall  Father had colon cancer as did uncle  HIV screen neg   Zoster status -is interested in shingrix   BP Readings from Last 3 Encounters:  03/29/18 108/74  11/02/17 100/63  10/18/17 100/62   Pulse Readings from Last 3 Encounters:  03/29/18 67  11/02/17 66  10/18/17 64   Cholesterol Lab Results  Component Value Date   CHOL 176 03/22/2018   CHOL 133 03/20/2017   CHOL 205 (H) 04/28/2016   Lab Results  Component Value Date   HDL 60.70 03/22/2018   HDL 48.10 03/20/2017   HDL 52.70 04/28/2016   Lab Results  Component Value Date   LDLCALC 101 (H) 03/22/2018   LDLCALC 73 03/20/2017   LDLCALC 125 (H) 04/28/2016   Lab Results  Component Value Date   TRIG 72.0 03/22/2018   TRIG 57.0 03/20/2017   TRIG 136.0 04/28/2016   Lab Results  Component Value Date   CHOLHDL 3 03/22/2018   CHOLHDL 3 03/20/2017   CHOLHDL 4 04/28/2016   Lab Results  Component Value Date   LDLDIRECT 107.7 07/14/2013   No change in risk ratio Ate shrimp the day before    Other labs Results for orders placed or performed in visit on 03/22/18  HIV antibody  Result Value Ref Range   HIV 1&2 Ab, 4th Generation NON-REACTIVE NON-REACTI  TSH  Result Value Ref Range   TSH 2.52 0.35 - 4.50 uIU/mL  Lipid panel  Result Value Ref Range   Cholesterol 176 0 - 200 mg/dL   Triglycerides 72.0 0.0 - 149.0 mg/dL   HDL 60.70 >39.00 mg/dL   VLDL 14.4 0.0 - 40.0 mg/dL   LDL Cholesterol 101 (H) 0 - 99 mg/dL   Total CHOL/HDL Ratio 3    NonHDL 115.10   CBC with Differential/Platelet  Result Value Ref Range   WBC 5.2 4.0 - 10.5 K/uL   RBC 4.69 3.87 - 5.11 Mil/uL   Hemoglobin 13.6 12.0 - 15.0 g/dL   HCT 40.8 36.0 - 46.0 %   MCV 87.1 78.0 - 100.0 fl   MCHC 33.2 30.0 - 36.0 g/dL   RDW 13.2 11.5 - 15.5 %   Platelets 239.0 150.0 - 400.0 K/uL   Neutrophils Relative % 60.8 43.0 - 77.0 %   Lymphocytes Relative 28.5 12.0 - 46.0 %   Monocytes Relative 8.0 3.0 -  12.0 %   Eosinophils Relative 2.0 0.0 - 5.0 %   Basophils Relative 0.7 0.0 - 3.0 %   Neutro Abs 3.2 1.4 - 7.7 K/uL   Lymphs Abs 1.5 0.7 - 4.0 K/uL   Monocytes Absolute 0.4 0.1 - 1.0 K/uL   Eosinophils Absolute 0.1 0.0 - 0.7 K/uL   Basophils Absolute 0.0 0.0 - 0.1 K/uL  Comprehensive metabolic panel  Result Value Ref Range   Sodium 140 135 - 145 mEq/L   Potassium 4.5 3.5 - 5.1 mEq/L   Chloride 103 96 - 112 mEq/L   CO2 30 19 - 32 mEq/L   Glucose, Bld 106 (H) 70 - 99 mg/dL   BUN 24 (H) 6 - 23 mg/dL   Creatinine, Ser 0.97 0.40 - 1.20 mg/dL   Total Bilirubin 0.6 0.2 - 1.2 mg/dL   Alkaline Phosphatase 50 39 - 117 U/L   AST 15 0 - 37 U/L   ALT 19 0 - 35 U/L   Total Protein 7.3 6.0 - 8.3 g/dL   Albumin 4.5 3.5 - 5.2 g/dL   Calcium 10.1 8.4 - 10.5 mg/dL   GFR 64.33 >60.00 mL/min    Glucose 106   Patient Active Problem List   Diagnosis Date Noted  . Elevated blood sugar 03/29/2018  . Encounter for screening for HIV 03/23/2017  . Discharge from left nipple 05/01/2016  . S/P hysterectomy 05/04/2015  . Oral  aphthous ulcer 07/02/2012  . Other screening mammogram 05/17/2012  . Anemia, iron deficiency 05/08/2011  . Family history of colon cancer 05/08/2011  . Encounter for routine gynecological examination 05/08/2011  . Routine general medical examination at a health care facility 05/01/2011  . 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 MIGRAINE HEADACHE 02/14/2007  . RHINITIS, ALLERGIC NEC 02/14/2007   Past Medical History:  Diagnosis Date  . Chronic constipation   . Heavy menstrual bleeding   . Iron deficiency anemia    IV Iron infusions--  last infusion 09-23-22016  . Migraine   . Uterine fibroid   . Wears glasses    Past Surgical History:  Procedure Laterality Date  . BILATERAL SALPINGECTOMY Bilateral 05/04/2015   Procedure: BILATERAL SALPINGECTOMY;  Surgeon: Linda Hedges, DO;  Location: [facility];  Service: Gynecology;  Laterality: Bilateral;  . BREAST BIOPSY    . COLONOSCOPY  2007  . CYSTOSCOPY N/A 05/04/2015   Procedure: CYSTOSCOPY;  Surgeon: Linda Hedges, DO;  Location: [facility];  Service: Gynecology;  Laterality: N/A;  . DILATATION & CURETTAGE/HYSTEROSCOPY WITH TRUECLEAR N/A 10/22/2013   Procedure: DILATATION & CURETTAGE/HYSTEROSCOPY WITH TRUCLEAR;  Surgeon: Linda Hedges, DO;  Location: [facility];  Service: Gynecology;  Laterality: N/A;  . LAPAROSCOPIC ASSISTED VAGINAL HYSTERECTOMY N/A 05/04/2015   Procedure: LAPAROSCOPIC ASSISTED VAGINAL HYSTERECTOMY;  Surgeon: Linda Hedges, DO;  Location: [place];  Service: Gynecology;  Laterality: N/A;  need bed   Social History   Tobacco Use  . Smoking status: Former Smoker    Years: 2.00    Types: Cigarettes    Last attempt to quit: 07/31/1990    Years since quitting: 27.6  . Smokeless tobacco: Never Used  Substance Use Topics  . Alcohol use: Yes    Alcohol/week: 0.0 standard drinks    Comment: rare  . Drug use: No   Family History  Problem Relation Age of Onset  . Cancer Father        colon   . Colon cancer Father 56  . Hyperlipidemia Mother   .  Colon cancer Paternal Uncle 12  . Esophageal cancer Neg Hx   . Stomach cancer Neg Hx   . Rectal cancer Neg Hx    Allergies  Allergen Reactions  . Erythromycin Rash  . Penicillins Rash   Current Outpatient Medications on File Prior to Visit  Medication Sig Dispense Refill  . docusate sodium (COLACE) 100 MG capsule Take 1 capsule (100 mg total) by mouth 2 (two) times daily. 60 capsule 1  . ibuprofen (ADVIL,MOTRIN) 800 MG tablet Take 1 tablet (800 mg total) by mouth every 8 (eight) hours as needed. 30 tablet 0   Current Facility-Administered Medications on File Prior to Visit  Medication Dose Route Frequency Provider Last Rate Last Dose  . 0.9 %  sodium chloride infusion  500 mL Intravenous Once Nandigam, Kavitha V, MD        Review of Systems  Constitutional: Negative for activity change, appetite change, fatigue, fever and unexpected weight change.  HENT: Negative for congestion, ear pain, rhinorrhea, sinus pressure and sore throat.   Eyes: Negative for pain, redness and visual disturbance.  Respiratory: Negative for cough, shortness of breath and wheezing.   Cardiovascular: Negative for chest pain and palpitations.  Gastrointestinal: Negative for abdominal pain, blood in stool, constipation and diarrhea.  Endocrine: Negative for polydipsia and polyuria.  Genitourinary: Negative for dysuria, frequency and urgency.       Inverted nipple with occ clear nipple d/c on left  Musculoskeletal: Negative for arthralgias, back pain and myalgias.  Skin: Negative for pallor and rash.  Allergic/Immunologic: Negative for environmental allergies.  Neurological: Negative for dizziness, syncope, weakness and headaches.  Hematological: Negative for adenopathy. Does not bruise/bleed easily.  Psychiatric/Behavioral: Negative for decreased concentration and dysphoric mood. The patient is not nervous/anxious.        Objective:   Physical Exam    Constitutional: She appears well-developed and well-nourished. No distress.  overwt and well app  HENT:  Head: Normocephalic and atraumatic.  Right Ear: External ear normal.  Left Ear: External ear normal.  Mouth/Throat: Oropharynx is clear and moist.  Eyes: Pupils are equal, round, and reactive to light. Conjunctivae and EOM are normal. No scleral icterus.  Neck: Normal range of motion. Neck supple. No JVD present. Carotid bruit is not present. No thyromegaly present.  Cardiovascular: Normal rate, regular rhythm, normal heart sounds and intact distal pulses. Exam reveals no gallop.  Pulmonary/Chest: Effort normal and breath sounds normal. No respiratory distress. She has no wheezes. She exhibits no tenderness. No breast tenderness, discharge or bleeding.  Abdominal: Soft. Bowel sounds are normal. She exhibits no distension, no abdominal bruit and no mass. There is no tenderness.  Genitourinary: No breast tenderness, discharge or bleeding.  Genitourinary Comments: Breast exam: No mass, nodules, thickening, tenderness, bulging, retraction, inflamation, nipple discharge or skin changes noted.  No axillary or clavicular LA.     Baseline inverted nipple on L -no d/c from nipple today  Musculoskeletal: Normal range of motion. She exhibits no edema or tenderness.  Lymphadenopathy:    She has no cervical adenopathy.  Neurological: She is alert. She has normal reflexes. She displays normal reflexes. No cranial nerve deficit. She exhibits normal muscle tone. Coordination normal.  Skin: Skin is warm and dry. No rash noted. No erythema. No pallor.  Solar lentigines diffusely   Psychiatric: She has a normal mood and affect.          Assessment & Plan:   Problem List Items Addressed This Visit  Other   Discharge from left nipple    Ongoing with nipple inversion Due for f/u imaging- poss MRI  Ref to surgeon for re check        Relevant Orders   Ambulatory referral to General Surgery    Elevated blood sugar    106 fasting  disc imp of low glycemic diet and wt loss to prevent DM2       Family history of colon cancer    Colonoscopy 4/19 rev- polyp   Has 5 y recall       Routine general medical examination at a health care facility - Primary    Reviewed health habits including diet and exercise and skin cancer prevention Reviewed appropriate screening tests for age  Also reviewed health mt list, fam hx and immunization status , as well as social and family history    See HPI Labs reviewed  Will get flu shot in the fall  Recommend shingrix if covered and available

## 2018-03-29 NOTE — Patient Instructions (Addendum)
Let's refer you to surgeon for breast follow up   Get a flu shot in the fall   If you are interested in the new shingles vaccine (Shingrix) - call your local pharmacy to check on coverage and availability  If affordable- get on a wait list at your pharmacy   To avoid diabetes Try to get most of your carbohydrates from produce (with the exception of white potatoes)  Eat less bread/pasta/rice/snack foods/cereals/sweets and other items from the middle of the grocery store (processed carbs)

## 2018-04-01 NOTE — Assessment & Plan Note (Signed)
Ongoing with nipple inversion Due for f/u imaging- poss MRI  Ref to surgeon for re check

## 2018-04-01 NOTE — Assessment & Plan Note (Signed)
Reviewed health habits including diet and exercise and skin cancer prevention Reviewed appropriate screening tests for age  Also reviewed health mt list, fam hx and immunization status , as well as social and family history    See HPI Labs reviewed  Will get flu shot in the fall  Recommend shingrix if covered and available

## 2018-04-01 NOTE — Assessment & Plan Note (Signed)
Colonoscopy 4/19 rev- polyp   Has 5 y recall

## 2018-04-01 NOTE — Assessment & Plan Note (Signed)
106 fasting  disc imp of low glycemic diet and wt loss to prevent DM2

## 2018-04-09 ENCOUNTER — Other Ambulatory Visit: Payer: Self-pay | Admitting: Surgery

## 2018-04-09 DIAGNOSIS — Z1231 Encounter for screening mammogram for malignant neoplasm of breast: Secondary | ICD-10-CM

## 2018-04-11 ENCOUNTER — Ambulatory Visit
Admission: RE | Admit: 2018-04-11 | Discharge: 2018-04-11 | Disposition: A | Discharge status: Home / Self Care | Payer: 59 | Source: Ambulatory Visit | Attending: Surgery | Admitting: Surgery

## 2018-04-11 DIAGNOSIS — Z1231 Encounter for screening mammogram for malignant neoplasm of breast: Secondary | ICD-10-CM | POA: Diagnosis not present

## 2018-04-12 ENCOUNTER — Other Ambulatory Visit: Payer: Self-pay | Admitting: Family Medicine

## 2018-04-12 ENCOUNTER — Other Ambulatory Visit: Payer: Self-pay | Admitting: Surgery

## 2018-04-12 DIAGNOSIS — R928 Other abnormal and inconclusive findings on diagnostic imaging of breast: Secondary | ICD-10-CM

## 2018-04-12 DIAGNOSIS — N6452 Nipple discharge: Secondary | ICD-10-CM | POA: Diagnosis not present

## 2018-04-12 DIAGNOSIS — N6320 Unspecified lump in the left breast, unspecified quadrant: Secondary | ICD-10-CM | POA: Diagnosis not present

## 2018-04-19 ENCOUNTER — Ambulatory Visit
Admission: RE | Admit: 2018-04-19 | Discharge: 2018-04-19 | Disposition: A | Discharge status: Home / Self Care | Payer: 59 | Source: Ambulatory Visit | Attending: Family Medicine | Admitting: Family Medicine

## 2018-04-19 DIAGNOSIS — N6489 Other specified disorders of breast: Secondary | ICD-10-CM | POA: Diagnosis not present

## 2018-04-19 DIAGNOSIS — R928 Other abnormal and inconclusive findings on diagnostic imaging of breast: Secondary | ICD-10-CM

## 2018-04-19 DIAGNOSIS — R922 Inconclusive mammogram: Secondary | ICD-10-CM | POA: Diagnosis not present

## 2018-04-22 ENCOUNTER — Other Ambulatory Visit: Payer: Self-pay | Admitting: Surgery

## 2018-05-03 ENCOUNTER — Ambulatory Visit
Admission: RE | Admit: 2018-05-03 | Discharge: 2018-05-03 | Disposition: A | Discharge status: Home / Self Care | Payer: 59 | Source: Ambulatory Visit | Attending: Surgery | Admitting: Surgery

## 2018-05-03 DIAGNOSIS — N6453 Retraction of nipple: Secondary | ICD-10-CM | POA: Diagnosis not present

## 2018-05-03 DIAGNOSIS — N6310 Unspecified lump in the right breast, unspecified quadrant: Secondary | ICD-10-CM

## 2018-05-03 MED ORDER — GADOBENATE DIMEGLUMINE 529 MG/ML IV SOLN
15.0000 mL | Freq: Once | INTRAVENOUS | Status: AC | PRN
  Administered 2018-05-03: 15 mL via INTRAVENOUS

## 2018-05-10 ENCOUNTER — Ambulatory Visit: Payer: Self-pay | Admitting: Surgery

## 2018-05-10 DIAGNOSIS — N6452 Nipple discharge: Secondary | ICD-10-CM | POA: Diagnosis not present

## 2018-05-10 NOTE — H&P (Signed)
History of Present Illness Katie Espinoza. Katie Stehlin MD; 05/10/2018 11:41 AM) The patient is a 52 year old female who presents with a complaint of nipple discharge. This is a 52 year old female who presents with a two year history of some clear left nipple drainage. 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 The patient cannot actually see the drainage coming out but has noticed a wet spot on her bra on an intermittent basis. Since the initial evaluation in October 2017, the drainage has decreased but is still occasionally visible. She denies any bloody discharge. She has no right nipple discharge. She has a slight area of tenderness just lateral to her left nipple at 3:00. She underwent mammogram and ultrasound that showed no explanation for etiology of the nipple discharge. 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 After our visit in October2017, I obtained a MRI which showed findings bilaterally. She underwent ultrasounds which showed no sonographic correlates, so she subsequently underwent bilateral MR guided biopsies. Biopsies showed only fibrocystic changes and dilated ducts, but no atypia or malignancy.  Menarche age 36 First pregnancy age 72 Breast-feeding yes Oral contraceptives 10 years Family history negative Hysterectomy in 2016 for bleeding and fibroids. The patient still has both ovaries  Since the last visit, we obtained left diagnostic mammogram/ ultrasound as well as MRI.  CLINICAL DATA: Patient was called back from screening mammogram for a possible mass in the left breast. Patient states that she has been having spontaneous nipple discharge since 2017 (patient had an MR in 2017 for nipple discharge which subsequently led to and MR guided core biopsy in December of 2017 for an area of non mass like retro areolar enhancement which was benign). Patient states the left nipple inversion is worse.  EXAM: DIGITAL DIAGNOSTIC LEFT MAMMOGRAM WITH  TOMO  ULTRASOUND LEFT BREAST  COMPARISON: Previous exam(s).  ACR Breast Density Category c: The breast tissue is heterogeneously dense, which may obscure small masses.  FINDINGS: Additional imaging of the left breast was performed. There is left nipple inversion. A metallic marker clip is noted in the medial retroareolar region of the breast. On the spot compression views there is mild persistent asymmetry adjacent to the biopsy clip best seen on the MLO view. There are no malignant type microcalcifications.  On physical exam, no mass is palpated in the left breast. The left nipple is inverted.  Targeted ultrasound is performed, showing no dilated ducts in the retroareolar region of the left breast. No intraductal mass identified. No suspicious solid or cystic mass identified.  IMPRESSION: Further evaluation of the left breast asymmetry and left nipple discharge and nipple inversion with MRI is recommended.  RECOMMENDATION: Breast MRI is recommended.  I have discussed the findings and recommendations with the patient. Results were also provided in writing at the conclusion of the visit. If applicable, a reminder letter will be sent to the patient regarding the next appointment.  BI-RADS CATEGORY 0: Incomplete. Need additional imaging evaluation and/or prior mammograms for comparison.   Electronically Signed By: Lillia Mountain M.D. On: 04/19/2018 08:57  CLINICAL DATA: Spontaneous brown left nipple discharge for the past 2 years. The patient had a breast MRI in 2017 for nipple discharge with subsequent bilateral MR guided core needle biopsies with benign results. Increased left nipple inversion.  LABS: None obtained on site today.  EXAM: BILATERAL BREAST MRI WITH AND WITHOUT CONTRAST  TECHNIQUE: Multiplanar, multisequence MR images of both breasts were obtained prior to and following the intravenous administration  of 15 ml of MultiHance.  Three-dimensional  MR images were rendered by post-processing the original MR data using the DynaCAD thin client. The 3D MR images are interpreted and the findings are included in the complete MRI report below.  COMPARISON: Bilateral screening mammogram dated 04/11/2018, left diagnostic mammogram and left breast ultrasound dated 04/19/2018, bilateral breast MRI dated 06/02/2016 and left breast MR guided core needle biopsy dated 07/19/2016.  FINDINGS: Breast composition: c. Heterogeneous fibroglandular tissue.  Background parenchymal enhancement: Mild.  Right breast: No mass or abnormal enhancement.  Left breast: 1.6 x 1.0 x 0.7 cm area of non mass enhancement in the retro nipple region of the left breast. This has persistent enhancement kinetics. This appears larger, more confluent and more intensely enhancing than the abnormal enhancement seen in this area on 06/02/2016, previously biopsied. The biopsy marker clip artifact is located 5 mm lateral to the lateral edge of this enhancement. The degree of nipple retraction has not changed significantly. No findings elsewhere in the left breast suspicious for malignancy.  Lymph nodes: No abnormal appearing lymph nodes.  Ancillary findings: None.  IMPRESSION: 1.6 x 1.0 x 0.7 cm area of non mass enhancement in the retro nipple region of the left breast which is larger, more confluent and more intensely enhancing than seen on 06/02/2016. This most likely represents progressive periductal inflammatory changes. However, malignancy cannot be excluded.  RECOMMENDATION: Surgical excision or MR guided core needle biopsy of the 1.6 cm area of non mass enhancement in the retro nipple region of the left breast.  BI-RADS CATEGORY 4: Suspicious.   Electronically Signed By: Claudie Revering M.D. On: 05/03/2018 15:41    Problem List/Past Medical Rodman Key K. Jonel Sick, MD; 05/10/2018 11:42 AM) MASS OF RIGHT BREAST ON MAMMOGRAM (N63.10) DISCHARGE FROM LEFT  NIPPLE (I45.80)  Past Surgical History (Johnathon Olden K. Laurine Kuyper, MD; 05/10/2018 11:42 AM) Hysterectomy (not due to cancer) - Partial  Diagnostic Studies History (Berea Majkowski K. Gowri Suchan, MD; 05/10/2018 11:42 AM) Colonoscopy 5-10 years ago Mammogram within last year Pap Smear 1-5 years ago  Allergies Mammie Lorenzo, LPN; 99/83/3825 05:39 AM) Erythromycin (Acne Aid) *DERMATOLOGICALS* Penicillin G Procaine *PENICILLINS*  Medication History Mammie Lorenzo, LPN; 76/73/4193 79:02 AM) Dulcolax (5MG  Tablet DR, Oral daily) Active. Medications Reconciled  Social History Katie Espinoza. Jazziel Fitzsimmons, MD; 05/10/2018 11:42 AM) Alcohol use Occasional alcohol use. Caffeine use Coffee. No drug use Tobacco use Former smoker.  Family History Katie Espinoza. Jazmina Muhlenkamp, MD; 05/10/2018 11:42 AM) Alcohol Abuse Brother, Sister. Breast Cancer Mother. Colon Cancer Father. Depression Brother, Sister. Diabetes Mellitus Father, Mother. Hypertension Father, Mother. Melanoma Mother. Migraine Headache Mother.  Pregnancy / Birth History Katie Espinoza. Anahis Furgeson, MD; 05/10/2018 11:42 AM) Age at menarche 57 years. Contraceptive History Oral contraceptives. Gravida 3 Irregular periods Length (months) of breastfeeding 7-12 Maternal age 32-25 Para 2  Other Problems Katie Espinoza. Analucia Hush, MD; 05/10/2018 11:42 AM) Asthma Back Pain Chest pain Gastroesophageal Reflux Disease Hemorrhoids Migraine Headache    Vitals Claiborne Billings Dockery LPN; 40/97/3532 99:24 AM) 05/10/2018 10:56 AM Weight: 170.6 lb Height: 65in Body Surface Area: 1.85 m Body Mass Index: 28.39 kg/m  Temp.: 97.57F(Temporal)  Pulse: 66 (Regular)  BP: 116/68 (Sitting, Left Arm, Standard)      Physical Exam Rodman Key K. Azlin Zilberman MD; 05/10/2018 11:42 AM)  The physical exam findings are as follows: Note:WDWN in NAD Eyes: Pupils equal, round; sclera anicteric HENT: Oral mucosa moist; good dentition Neck: No masses palpated, no  thyromegaly Breasts: right breast - firm fibrocystic changes throughout; no nipple retraction or nipple discharge; no  dominant masses; no axillary lymphadenopathy left breast - firm fibrocystic changes throughout; left nipple seems to be more retracted - unable to visualize any discharge at this time; no dominant masses; no axillary lymphadenopathy; Lungs: CTA bilaterally; normal respiratory effort CV: Regular rate and rhythm; no murmurs; extremities well-perfused with no edema Abd: +bowel sounds, soft, non-tender, no palpable organomegaly; no palpable hernias Skin: Warm, dry; no sign of jaundice Psychiatric - alert and oriented x 4; calm mood and affect    Assessment & Plan Rodman Key K. Timm Bonenberger MD; 05/10/2018 11:43 AM)  DISCHARGE FROM LEFT NIPPLE (N64.52)  Current Plans Schedule for Surgery - Left retroareolar lumpectomy. The surgical procedure has been discussed with the patient. Potential risks, benefits, alternative treatments, and expected outcomes have been explained. All of the patient's questions at this time have been answered. The likelihood of reaching the patient's treatment goal is good. The patient understand the proposed surgical procedure and wishes to proceed. Note:We will excise this 1.6 cm area of non-mass enhancement in the left retroareolar region which has become larger and enhances more intensely than in 2017. Malignancy cannot be excluded although this most likely represents periductal inflammatory changes.  Katie Espinoza. Georgette Dover, MD, [facility] Surgery  General/ Trauma Surgery Beeper [phone]  05/10/2018 11:43 AM

## 2018-06-04 ENCOUNTER — Other Ambulatory Visit: Payer: Self-pay

## 2018-06-04 ENCOUNTER — Encounter (HOSPITAL_BASED_OUTPATIENT_CLINIC_OR_DEPARTMENT_OTHER): Payer: Self-pay | Admitting: *Deleted

## 2018-06-07 NOTE — Progress Notes (Signed)
Ensure pre surgery drink given with instructions to complete by [facility], pt verbalized understanding.

## 2018-06-13 ENCOUNTER — Ambulatory Visit (HOSPITAL_BASED_OUTPATIENT_CLINIC_OR_DEPARTMENT_OTHER): Payer: 59 | Admitting: Anesthesiology

## 2018-06-13 ENCOUNTER — Ambulatory Visit (HOSPITAL_BASED_OUTPATIENT_CLINIC_OR_DEPARTMENT_OTHER)
Admission: RE | Admit: 2018-06-13 | Discharge: 2018-06-13 | Disposition: A | Discharge status: Home / Self Care | Payer: 59 | Source: Ambulatory Visit | Attending: Surgery | Admitting: Surgery

## 2018-06-13 ENCOUNTER — Encounter (HOSPITAL_BASED_OUTPATIENT_CLINIC_OR_DEPARTMENT_OTHER): Payer: Self-pay | Admitting: Anesthesiology

## 2018-06-13 ENCOUNTER — Other Ambulatory Visit: Payer: Self-pay

## 2018-06-13 ENCOUNTER — Encounter (HOSPITAL_BASED_OUTPATIENT_CLINIC_OR_DEPARTMENT_OTHER)
Admission: RE | Disposition: A | Discharge status: Home / Self Care | Payer: Self-pay | Source: Ambulatory Visit | Attending: Surgery

## 2018-06-13 DIAGNOSIS — N6092 Unspecified benign mammary dysplasia of left breast: Secondary | ICD-10-CM | POA: Diagnosis not present

## 2018-06-13 DIAGNOSIS — N6489 Other specified disorders of breast: Secondary | ICD-10-CM | POA: Insufficient documentation

## 2018-06-13 DIAGNOSIS — Z881 Allergy status to other antibiotic agents status: Secondary | ICD-10-CM | POA: Diagnosis not present

## 2018-06-13 DIAGNOSIS — Z87891 Personal history of nicotine dependence: Secondary | ICD-10-CM | POA: Diagnosis not present

## 2018-06-13 DIAGNOSIS — N6082 Other benign mammary dysplasias of left breast: Secondary | ICD-10-CM | POA: Diagnosis not present

## 2018-06-13 DIAGNOSIS — N6342 Unspecified lump in left breast, subareolar: Secondary | ICD-10-CM | POA: Diagnosis not present

## 2018-06-13 DIAGNOSIS — Z880 Allergy status to penicillin: Secondary | ICD-10-CM | POA: Diagnosis not present

## 2018-06-13 DIAGNOSIS — N6452 Nipple discharge: Secondary | ICD-10-CM | POA: Insufficient documentation

## 2018-06-13 DIAGNOSIS — J45909 Unspecified asthma, uncomplicated: Secondary | ICD-10-CM | POA: Insufficient documentation

## 2018-06-13 DIAGNOSIS — Z79899 Other long term (current) drug therapy: Secondary | ICD-10-CM | POA: Diagnosis not present

## 2018-06-13 DIAGNOSIS — N61 Mastitis without abscess: Secondary | ICD-10-CM | POA: Diagnosis not present

## 2018-06-13 DIAGNOSIS — N6032 Fibrosclerosis of left breast: Secondary | ICD-10-CM | POA: Diagnosis not present

## 2018-06-13 DIAGNOSIS — K219 Gastro-esophageal reflux disease without esophagitis: Secondary | ICD-10-CM | POA: Insufficient documentation

## 2018-06-13 HISTORY — PX: BREAST LUMPECTOMY: SHX2

## 2018-06-13 HISTORY — DX: Other specified postprocedural states: Z98.890

## 2018-06-13 HISTORY — DX: Nausea with vomiting, unspecified: R11.2

## 2018-06-13 HISTORY — DX: Other specified postprocedural states: R11.2

## 2018-06-13 SURGERY — BREAST LUMPECTOMY
Anesthesia: General | Site: Breast | Laterality: Left

## 2018-06-13 MED ORDER — MEPERIDINE HCL 25 MG/ML IJ SOLN: 6.2500 mg | INTRAMUSCULAR | Status: DC | PRN

## 2018-06-13 MED ORDER — PROMETHAZINE HCL 25 MG/ML IJ SOLN: 6.2500 mg | INTRAMUSCULAR | Status: DC | PRN

## 2018-06-13 MED ORDER — DEXAMETHASONE SODIUM PHOSPHATE 10 MG/ML IJ SOLN
INTRAMUSCULAR | Status: AC
  Filled 2018-06-13: qty 1

## 2018-06-13 MED ORDER — CEFAZOLIN SODIUM-DEXTROSE 2-4 GM/100ML-% IV SOLN
2.0000 g | INTRAVENOUS | Status: AC
  Administered 2018-06-13: 2 g via INTRAVENOUS

## 2018-06-13 MED ORDER — HYDROMORPHONE HCL 1 MG/ML IJ SOLN: 0.2500 mg | INTRAMUSCULAR | Status: DC | PRN

## 2018-06-13 MED ORDER — SUGAMMADEX SODIUM 500 MG/5ML IV SOLN
INTRAVENOUS | Status: AC
  Filled 2018-06-13: qty 5

## 2018-06-13 MED ORDER — LIDOCAINE 2% (20 MG/ML) 5 ML SYRINGE
INTRAMUSCULAR | Status: DC | PRN
  Administered 2018-06-13: 50 mg via INTRAVENOUS

## 2018-06-13 MED ORDER — LACTATED RINGERS IV SOLN
INTRAVENOUS | Status: DC
  Administered 2018-06-13: 15:00:00 via INTRAVENOUS

## 2018-06-13 MED ORDER — OXYCODONE HCL 5 MG PO TABS: 5.0000 mg | ORAL_TABLET | Freq: Once | ORAL | Status: DC | PRN

## 2018-06-13 MED ORDER — CELECOXIB 200 MG PO CAPS
ORAL_CAPSULE | ORAL | Status: AC
  Filled 2018-06-13: qty 1

## 2018-06-13 MED ORDER — HYDROCODONE-ACETAMINOPHEN 5-325 MG PO TABS: 1.0000 | ORAL_TABLET | Freq: Four times a day (QID) | ORAL | 0 refills | Status: DC | PRN

## 2018-06-13 MED ORDER — ACETAMINOPHEN 500 MG PO TABS
ORAL_TABLET | ORAL | Status: AC
  Filled 2018-06-13: qty 2

## 2018-06-13 MED ORDER — FENTANYL CITRATE (PF) 100 MCG/2ML IJ SOLN
50.0000 ug | INTRAMUSCULAR | Status: DC | PRN
  Administered 2018-06-13: 50 ug via INTRAVENOUS

## 2018-06-13 MED ORDER — PROPOFOL 10 MG/ML IV BOLUS
INTRAVENOUS | Status: DC | PRN
  Administered 2018-06-13: 180 mg via INTRAVENOUS

## 2018-06-13 MED ORDER — FENTANYL CITRATE (PF) 100 MCG/2ML IJ SOLN
INTRAMUSCULAR | Status: AC
  Filled 2018-06-13: qty 2

## 2018-06-13 MED ORDER — GABAPENTIN 300 MG PO CAPS
ORAL_CAPSULE | ORAL | Status: AC
  Filled 2018-06-13: qty 1

## 2018-06-13 MED ORDER — EPHEDRINE SULFATE 50 MG/ML IJ SOLN
INTRAMUSCULAR | Status: DC | PRN
  Administered 2018-06-13: 10 mg via INTRAVENOUS

## 2018-06-13 MED ORDER — SCOPOLAMINE 1 MG/3DAYS TD PT72
1.0000 | MEDICATED_PATCH | Freq: Once | TRANSDERMAL | Status: DC | PRN
  Administered 2018-06-13: 1.5 mg via TRANSDERMAL

## 2018-06-13 MED ORDER — ONDANSETRON HCL 4 MG/2ML IJ SOLN
INTRAMUSCULAR | Status: DC | PRN
  Administered 2018-06-13: 4 mg via INTRAVENOUS

## 2018-06-13 MED ORDER — ONDANSETRON HCL 4 MG/2ML IJ SOLN
INTRAMUSCULAR | Status: AC
  Filled 2018-06-13: qty 2

## 2018-06-13 MED ORDER — GABAPENTIN 300 MG PO CAPS
300.0000 mg | ORAL_CAPSULE | ORAL | Status: AC
  Administered 2018-06-13: 300 mg via ORAL

## 2018-06-13 MED ORDER — BUPIVACAINE-EPINEPHRINE 0.25% -1:200000 IJ SOLN
INTRAMUSCULAR | Status: DC | PRN
  Administered 2018-06-13: 10 mL

## 2018-06-13 MED ORDER — CHLORHEXIDINE GLUCONATE CLOTH 2 % EX PADS: 6.0000 | MEDICATED_PAD | Freq: Once | CUTANEOUS | Status: DC

## 2018-06-13 MED ORDER — EPHEDRINE 5 MG/ML INJ
INTRAVENOUS | Status: AC
  Filled 2018-06-13: qty 10

## 2018-06-13 MED ORDER — OXYCODONE HCL 5 MG/5ML PO SOLN: 5.0000 mg | Freq: Once | ORAL | Status: DC | PRN

## 2018-06-13 MED ORDER — CELECOXIB 200 MG PO CAPS
200.0000 mg | ORAL_CAPSULE | ORAL | Status: AC
  Administered 2018-06-13: 200 mg via ORAL

## 2018-06-13 MED ORDER — ACETAMINOPHEN 500 MG PO TABS: 1000.0000 mg | ORAL_TABLET | ORAL | Status: DC

## 2018-06-13 MED ORDER — MIDAZOLAM HCL 2 MG/2ML IJ SOLN
INTRAMUSCULAR | Status: AC
  Filled 2018-06-13: qty 2

## 2018-06-13 MED ORDER — LIDOCAINE 2% (20 MG/ML) 5 ML SYRINGE
INTRAMUSCULAR | Status: AC
  Filled 2018-06-13: qty 5

## 2018-06-13 MED ORDER — DEXAMETHASONE SODIUM PHOSPHATE 4 MG/ML IJ SOLN
INTRAMUSCULAR | Status: DC | PRN
  Administered 2018-06-13: 10 mg via INTRAVENOUS

## 2018-06-13 MED ORDER — CEFAZOLIN SODIUM-DEXTROSE 2-4 GM/100ML-% IV SOLN
INTRAVENOUS | Status: AC
  Filled 2018-06-13: qty 100

## 2018-06-13 MED ORDER — KETOROLAC TROMETHAMINE 30 MG/ML IJ SOLN
INTRAMUSCULAR | Status: AC
  Filled 2018-06-13: qty 1

## 2018-06-13 MED ORDER — PROPOFOL 10 MG/ML IV BOLUS
INTRAVENOUS | Status: AC
  Filled 2018-06-13: qty 20

## 2018-06-13 MED ORDER — MIDAZOLAM HCL 2 MG/2ML IJ SOLN
1.0000 mg | INTRAMUSCULAR | Status: DC | PRN
  Administered 2018-06-13: 2 mg via INTRAVENOUS

## 2018-06-13 SURGICAL SUPPLY — 59 items
APL SKNCLS STERI-STRIP NONHPOA (GAUZE/BANDAGES/DRESSINGS) ×1
APL SWBSTK 6 STRL LF DISP (MISCELLANEOUS)
APPLICATOR COTTON TIP 6 STRL (MISCELLANEOUS) IMPLANT
APPLICATOR COTTON TIP 6IN STRL (MISCELLANEOUS)
APPLIER CLIP 9.375 MED OPEN (MISCELLANEOUS)
APR CLP MED 9.3 20 MLT OPN (MISCELLANEOUS)
BENZOIN TINCTURE PRP APPL 2/3 (GAUZE/BANDAGES/DRESSINGS) ×3 IMPLANT
BLADE HEX COATED 2.75 (ELECTRODE) ×3 IMPLANT
BLADE SURG 15 STRL LF DISP TIS (BLADE) ×1 IMPLANT
BLADE SURG 15 STRL SS (BLADE) ×3
CANISTER SUCT 1200ML W/VALVE (MISCELLANEOUS) ×3 IMPLANT
CHLORAPREP W/TINT 26ML (MISCELLANEOUS) ×3 IMPLANT
CLIP APPLIE 9.375 MED OPEN (MISCELLANEOUS) IMPLANT
CLOSURE WOUND 1/2 X4 (GAUZE/BANDAGES/DRESSINGS) ×1
COVER BACK TABLE 60X90IN (DRAPES) ×3 IMPLANT
COVER MAYO STAND STRL (DRAPES) ×3 IMPLANT
COVER WAND RF STERILE (DRAPES) IMPLANT
DECANTER SPIKE VIAL GLASS SM (MISCELLANEOUS) IMPLANT
DEVICE DUBIN W/COMP PLATE 8390 (MISCELLANEOUS) IMPLANT
DRAPE LAPAROTOMY 100X72 PEDS (DRAPES) ×3 IMPLANT
DRAPE UTILITY XL STRL (DRAPES) ×3 IMPLANT
DRSG TEGADERM 2-3/8X2-3/4 SM (GAUZE/BANDAGES/DRESSINGS) ×2 IMPLANT
DRSG TEGADERM 4X4.75 (GAUZE/BANDAGES/DRESSINGS) ×1 IMPLANT
ELECT REM PT RETURN 9FT ADLT (ELECTROSURGICAL) ×3
ELECTRODE REM PT RTRN 9FT ADLT (ELECTROSURGICAL) ×1 IMPLANT
GAUZE SPONGE 4X4 12PLY STRL LF (GAUZE/BANDAGES/DRESSINGS) ×1 IMPLANT
GLOVE BIO SURGEON STRL SZ 6.5 (GLOVE) ×1 IMPLANT
GLOVE BIO SURGEON STRL SZ7 (GLOVE) ×3 IMPLANT
GLOVE BIO SURGEONS STRL SZ 6.5 (GLOVE) ×1
GLOVE BIOGEL PI IND STRL 7.0 (GLOVE) ×1 IMPLANT
GLOVE BIOGEL PI IND STRL 7.5 (GLOVE) ×1 IMPLANT
GLOVE BIOGEL PI INDICATOR 7.0 (GLOVE) ×2
GLOVE BIOGEL PI INDICATOR 7.5 (GLOVE) ×2
GLOVE EXAM NITRILE MD LF STRL (GLOVE) ×2 IMPLANT
GOWN STRL REUS W/ TWL LRG LVL3 (GOWN DISPOSABLE) ×2 IMPLANT
GOWN STRL REUS W/TWL LRG LVL3 (GOWN DISPOSABLE) ×6
ILLUMINATOR WAVEGUIDE N/F (MISCELLANEOUS) IMPLANT
KIT MARKER MARGIN INK (KITS) ×2 IMPLANT
LIGHT WAVEGUIDE WIDE FLAT (MISCELLANEOUS) IMPLANT
NDL HYPO 25X1 1.5 SAFETY (NEEDLE) ×1 IMPLANT
NEEDLE HYPO 25X1 1.5 SAFETY (NEEDLE) ×3 IMPLANT
NS IRRIG 1000ML POUR BTL (IV SOLUTION) ×3 IMPLANT
PACK BASIN DAY SURGERY FS (CUSTOM PROCEDURE TRAY) ×3 IMPLANT
PENCIL BUTTON HOLSTER BLD 10FT (ELECTRODE) ×3 IMPLANT
SLEEVE SCD COMPRESS KNEE MED (MISCELLANEOUS) ×3 IMPLANT
SPONGE LAP 4X18 RFD (DISPOSABLE) ×3 IMPLANT
STRIP CLOSURE SKIN 1/2X4 (GAUZE/BANDAGES/DRESSINGS) ×2 IMPLANT
SUT CHROMIC 3 0 SH 27 (SUTURE) IMPLANT
SUT MON AB 4-0 PC3 18 (SUTURE) ×3 IMPLANT
SUT SILK 2 0 SH (SUTURE) IMPLANT
SUT VIC AB 3-0 SH 27 (SUTURE) ×3
SUT VIC AB 3-0 SH 27X BRD (SUTURE) ×1 IMPLANT
SYR BULB 3OZ (MISCELLANEOUS) ×3 IMPLANT
SYR CONTROL 10ML LL (SYRINGE) ×3 IMPLANT
TOWEL GREEN STERILE FF (TOWEL DISPOSABLE) ×3 IMPLANT
TOWEL OR NON WOVEN STRL DISP B (DISPOSABLE) ×1 IMPLANT
TUBE CONNECTING 20'X1/4 (TUBING) ×1
TUBE CONNECTING 20X1/4 (TUBING) ×2 IMPLANT
YANKAUER SUCT BULB TIP NO VENT (SUCTIONS) ×3 IMPLANT

## 2018-06-13 NOTE — Discharge Instructions (Signed)
[place] Office Phone Number [phone]  BREAST BIOPSY/ PARTIAL MASTECTOMY: POST OP INSTRUCTIONS  Always review your discharge instruction sheet given to you by the facility where your surgery was performed.  IF YOU HAVE DISABILITY OR FAMILY LEAVE FORMS, YOU MUST BRING THEM TO THE OFFICE FOR PROCESSING.  DO NOT GIVE THEM TO YOUR DOCTOR.  1. A prescription for pain medication may be given to you upon discharge.  Take your pain medication as prescribed, if needed.  If narcotic pain medicine is not needed, then you may take acetaminophen (Tylenol) or ibuprofen (Advil) as needed. 2. Take your usually prescribed medications unless otherwise directed 3. If you need a refill on your pain medication, please contact your pharmacy.  They will contact our office to request authorization.  Prescriptions will not be filled after 5pm or on week-ends. 4. You should eat very light the first 24 hours after surgery, such as soup, crackers, pudding, etc.  Resume your normal diet the day after surgery. 5. Most patients will experience some swelling and bruising in the breast.  Ice packs and a good support bra will help.  Swelling and bruising can take several days to resolve.  6. It is common to experience some constipation if taking pain medication after surgery.  Increasing fluid intake and taking a stool softener will usually help or prevent this problem from occurring.  A mild laxative (Milk of Magnesia or Miralax) should be taken according to package directions if there are no bowel movements after 48 hours. 7. Unless discharge instructions indicate otherwise, you may remove your bandages 48 hours after surgery, and you may shower at that time.  You will have steri-strips (small skin tapes) in place directly over the incision.  These strips should be left on the skin for 7-10 days.   Any sutures or staples will be removed at the office during your follow-up visit. 8. ACTIVITIES:  You may resume  regular daily activities (gradually increasing) beginning the next day.  Wearing a good support bra or sports bra minimizes pain and swelling.  You may have sexual intercourse when it is comfortable. a. You may drive when you no longer are taking prescription pain medication, you can comfortably wear a seatbelt, and you can safely maneuver your car and apply brakes. b. RETURN TO WORK:  1-2 weeks 9. You should see your doctor in the office for a follow-up appointment approximately two weeks after your surgery.  Your doctors nurse will typically make your follow-up appointment when she calls you with your pathology report.  Expect your pathology report 2-3 business days after your surgery.  You may call to check if you do not hear from [place] after three days. 10. OTHER INSTRUCTIONS: _______________________________________________________________________________________________ _____________________________________________________________________________________________________________________________________ _____________________________________________________________________________________________________________________________________ _____________________________________________________________________________________________________________________________________  WHEN TO CALL YOUR DOCTOR: 1. Fever over 101.0 2. Nausea and/or vomiting. 3. Extreme swelling or bruising. 4. Continued bleeding from incision. 5. Increased pain, redness, or drainage from the incision.  The clinic staff is available to answer your questions during regular business hours.  Please dont hesitate to call and ask to speak to one of the nurses for clinical concerns.  If you have a medical emergency, go to the nearest emergency room or call 911.  A surgeon from [facility] is always on call at the hospital.  For further questions, please visit [email or web address]    Post Anesthesia Home Care  Instructions  Activity: Get plenty of rest for the remainder of the day. A responsible individual must stay with  you for 24 hours following the procedure.  For the next 24 hours, DO NOT: -Drive a car -Paediatric nurse -Drink alcoholic beverages -Take any medication unless instructed by your physician -Make any legal decisions or sign important papers.  Meals: Start with liquid foods such as gelatin or soup. Progress to regular foods as tolerated. Avoid greasy, spicy, heavy foods. If nausea and/or vomiting occur, drink only clear liquids until the nausea and/or vomiting subsides. Call your physician if vomiting continues.  Special Instructions/Symptoms: Your throat may feel dry or sore from the anesthesia or the breathing tube placed in your throat during surgery. If this causes discomfort, gargle with warm salt water. The discomfort should disappear within 24 hours.  If you had a scopolamine patch placed behind your ear for the management of post- operative nausea and/or vomiting:  1. The medication in the patch is effective for 72 hours, after which it should be removed.  Wrap patch in a tissue and discard in the trash. Wash hands thoroughly with soap and water. 2. You may remove the patch earlier than 72 hours if you experience unpleasant side effects which may include dry mouth, dizziness or visual disturbances. 3. Avoid touching the patch. Wash your hands with soap and water after contact with the patch.

## 2018-06-13 NOTE — Anesthesia Procedure Notes (Signed)
Procedure Name: LMA Insertion Performed by: Olamae Ferrara W, CRNA Pre-anesthesia Checklist: Patient identified, Emergency Drugs available, Suction available and Patient being monitored Patient Re-evaluated:Patient Re-evaluated prior to induction Oxygen Delivery Method: Circle system utilized Preoxygenation: Pre-oxygenation with 100% oxygen Induction Type: IV induction Ventilation: Mask ventilation without difficulty LMA: LMA inserted LMA Size: 4.0 Number of attempts: 1 Placement Confirmation: positive ETCO2 Tube secured with: Tape Dental Injury: Teeth and Oropharynx as per pre-operative assessment        

## 2018-06-13 NOTE — Anesthesia Postprocedure Evaluation (Signed)
Anesthesia Post Note  Patient: Katie Espinoza  Procedure(s) Performed: LEFT RETROAREOLAR  LUMPECTOMY (Left Breast)     Patient location during evaluation: PACU Anesthesia Type: General Level of consciousness: awake and alert Pain management: pain level controlled Vital Signs Assessment: post-procedure vital signs reviewed and stable Respiratory status: spontaneous breathing, nonlabored ventilation and respiratory function stable Cardiovascular status: blood pressure returned to baseline and stable Postop Assessment: no apparent nausea or vomiting Anesthetic complications: no    Last Vitals:  Vitals:   06/13/18 1612 06/13/18 1636  BP:  129/75  Pulse: 75 60  Resp: 13 16  Temp:  36.6 C  SpO2: 100% 99%    Last Pain:  Vitals:   06/13/18 1636  TempSrc:   PainSc: 2                  Lynda Rainwater

## 2018-06-13 NOTE — Op Note (Signed)
Pre-Op Diagnosis: Persistent left nipple discharge with retroareolar mass Postop diagnosis: Same Procedure performed: Left retroareolar lumpectomy Surgeon:Konrad Hoak K Jestin Burbach Anesthesia: General via LMA  Indications:  The patient is a 52 year old female who presents with a complaint of nipple discharge. This is a 52 year old female who presents with a two year history of some clear left nipple drainage. 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 The patient cannot actually see the drainage coming out but has noticed a wet spot on her bra on an intermittent basis. Since the initial evaluation in October 2017, the drainage has decreased but is still occasionally visible. She denies any bloody discharge. She has no right nipple discharge. She has a slight area of tenderness just lateral to her left nipple at 3:00. She underwent mammogram and ultrasound that showed no explanation for etiology of the nipple discharge. 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 After our visit in October2017, I obtained a MRI which showed findings bilaterally. She underwent ultrasounds which showed no sonographic correlates, so she subsequently underwent bilateral MR guided biopsies. Biopsies showed only fibrocystic changes and dilated ducts, but no atypia or malignancy.  Follow-up MRi this year showed 1.6 x 1.0 x 0.7 cm area of non mass enhancement in the retro nipple region of the left breast which is larger, more confluent and moreintensely enhancing than seen on 06/02/2016. This most likely represents progressive periductal inflammatory changes. However,malignancy cannot be excluded.  She presents now for retroareolar lumpectomy.    Description of procedure: The patient is brought to the operating room and placed in the supine position on the operating room table.  After an adequate level of general anesthesia was obtained, her left breast was prepped with ChloraPrep and draped in sterile  fashion.  A timeout was taken to ensure the proper patient and proper procedure.  We infiltrated with 0.25% Marcaine with epinephrine.  I made a curvilinear incision around the lower edge of the areola.  We dissected superiorly along the posterior surface of the nipple.  Several of the ducts are mildly dilated.  We excised a 2 x 2 centimeter area of retroareolar tissue including some obvious fibrous scar tissue directly behind the nipple.  The specimen was oriented with a paint kit.  We irrigated the wound thoroughly and inspected for hemostasis.  No other palpable masses were identified.  The wound was closed with a deep layer of 3-0 Vicryl and a subcuticular layer 4-0 Monocryl.  Benzoin Steri-Strips were applied.  A clean dressing was applied.  The patient was then extubated and brought to the recovery room in stable condition.  All sponge, instrument, and needle counts are correct.  Imogene Burn. Georgette Dover, MD, [facility]  General/ Trauma Surgery Beeper [phone]  06/13/2018 3:51 PM

## 2018-06-13 NOTE — Transfer of Care (Signed)
Immediate Anesthesia Transfer of Care Note  Patient: Katie Espinoza  Procedure(s) Performed: LEFT RETROAREOLAR  LUMPECTOMY (Left Breast)  Patient Location: PACU  Anesthesia Type:General  Level of Consciousness: awake and sedated  Airway & Oxygen Therapy: Patient Spontanous Breathing and Patient connected to face mask oxygen  Post-op Assessment: Report given to RN and Post -op Vital signs reviewed and stable  Post vital signs: Reviewed and stable  Last Vitals:  Vitals Value Taken Time  BP 97/47 06/13/2018  3:53 PM  Temp    Pulse 65 06/13/2018  3:54 PM  Resp 11 06/13/2018  3:54 PM  SpO2 100 % 06/13/2018  3:54 PM  Vitals shown include unvalidated device data.  Last Pain:  Vitals:   06/13/18 1335  TempSrc: Oral  PainSc: 0-No pain         Complications: No apparent anesthesia complications

## 2018-06-13 NOTE — H&P (Signed)
History of Present Illness The patient is a 52 year old female who presents with a complaint of nipple discharge. This is a 52 year old female who presents with a two year history of some clear left nipple drainage. 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 The patient cannot actually see the drainage coming out but has noticed a wet spot on her bra on an intermittent basis. Since the initial evaluation in October 2017, the drainage has decreased but is still occasionally visible. She denies any bloody discharge. She has no right nipple discharge. She has a slight area of tenderness just lateral to her left nipple at 3:00. She underwent mammogram and ultrasound that showed no explanation for etiology of the nipple discharge. 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 After our visit in October2017, I obtained a MRI which showed findings bilaterally. She underwent ultrasounds which showed no sonographic correlates, so she subsequently underwent bilateral MR guided biopsies. Biopsies showed only fibrocystic changes and dilated ducts, but no atypia or malignancy.  Menarche age 66 First pregnancy age 49 Breast-feeding yes Oral contraceptives 10 years Family history negative Hysterectomy in 2016 for bleeding and fibroids. The patient still has both ovaries  Since the last visit, we obtained left diagnostic mammogram/ ultrasound as well as MRI.  CLINICAL DATA: Patient was called back from screening mammogram for a possible mass in the left breast. Patient states that she has been having spontaneous nipple discharge since 2017 (patient had an MR in 2017 for nipple discharge which subsequently led to and MR guided core biopsy in December of 2017 for an area of non mass like retro areolar enhancement which was benign). Patient states the left nipple inversion is worse.  EXAM: DIGITAL DIAGNOSTIC LEFT MAMMOGRAM WITH TOMO  ULTRASOUND LEFT  BREAST  COMPARISON: Previous exam(s).  ACR Breast Density Category c: The breast tissue is heterogeneously dense, which may obscure small masses.  FINDINGS: Additional imaging of the left breast was performed. There is left nipple inversion. A metallic marker clip is noted in the medial retroareolar region of the breast. On the spot compression views there is mild persistent asymmetry adjacent to the biopsy clip best seen on the MLO view. There are no malignant type microcalcifications.  On physical exam, no mass is palpated in the left breast. The left nipple is inverted.  Targeted ultrasound is performed, showing no dilated ducts in the retroareolar region of the left breast. No intraductal mass identified. No suspicious solid or cystic mass identified.  IMPRESSION: Further evaluation of the left breast asymmetry and left nipple discharge and nipple inversion with MRI is recommended.  RECOMMENDATION: Breast MRI is recommended.  I have discussed the findings and recommendations with the patient. Results were also provided in writing at the conclusion of the visit. If applicable, a reminder letter will be sent to the patient regarding the next appointment.  BI-RADS CATEGORY 0: Incomplete. Need additional imaging evaluation and/or prior mammograms for comparison.   Electronically Signed By: Lillia Mountain M.D. On: 04/19/2018 08:57  CLINICAL DATA: Spontaneous brown left nipple discharge for the past 2 years. The patient had a breast MRI in 2017 for nipple discharge with subsequent bilateral MR guided core needle biopsies with benign results. Increased left nipple inversion.  LABS: None obtained on site today.  EXAM: BILATERAL BREAST MRI WITH AND WITHOUT CONTRAST  TECHNIQUE: Multiplanar, multisequence MR images of both breasts were obtained prior to and following the intravenous administration of 15 ml of MultiHance.  Three-dimensional  MR  images were rendered by post-processing the original MR data using the DynaCAD thin client. The 3D MR images are interpreted and the findings are included in the complete MRI report below.  COMPARISON: Bilateral screening mammogram dated 04/11/2018, left diagnostic mammogram and left breast ultrasound dated 04/19/2018, bilateral breast MRI dated 06/02/2016 and left breast MR guided core needle biopsy dated 07/19/2016.  FINDINGS: Breast composition: c. Heterogeneous fibroglandular tissue.  Background parenchymal enhancement: Mild.  Right breast: No mass or abnormal enhancement.  Left breast: 1.6 x 1.0 x 0.7 cm area of non mass enhancement in the retro nipple region of the left breast. This has persistent enhancement kinetics. This appears larger, more confluent and more intensely enhancing than the abnormal enhancement seen in this area on 06/02/2016, previously biopsied. The biopsy marker clip artifact is located 5 mm lateral to the lateral edge of this enhancement. The degree of nipple retraction has not changed significantly. No findings elsewhere in the left breast suspicious for malignancy.  Lymph nodes: No abnormal appearing lymph nodes.  Ancillary findings: None.  IMPRESSION: 1.6 x 1.0 x 0.7 cm area of non mass enhancement in the retro nipple region of the left breast which is larger, more confluent and more intensely enhancing than seen on 06/02/2016. This most likely represents progressive periductal inflammatory changes. However, malignancy cannot be excluded.  RECOMMENDATION: Surgical excision or MR guided core needle biopsy of the 1.6 cm area of non mass enhancement in the retro nipple region of the left breast.  BI-RADS CATEGORY 4: Suspicious.   Electronically Signed By: Claudie Revering M.D. On: 05/03/2018 15:41    Problem List/Past Medical  MASS OF RIGHT BREAST ON MAMMOGRAM (N63.10) DISCHARGE FROM LEFT NIPPLE (N64.52)  Past  Surgical History Hysterectomy (not due to cancer) - Partial  Diagnostic Studies History  Colonoscopy 5-10 years ago Mammogram within last year Pap Smear 1-5 years ago  Allergies  Erythromycin (Acne Aid) *DERMATOLOGICALS* Penicillin G Procaine *PENICILLINS*  Medication History  Dulcolax (5MG  Tablet DR, Oral daily) Active. Medications Reconciled  Social History Alcohol use Occasional alcohol use. Caffeine use Coffee. No drug use Tobacco use Former smoker.  Family History Alcohol Abuse Brother, Sister. Breast Cancer Mother. Colon Cancer Father. Depression Brother, Sister. Diabetes Mellitus Father, Mother. Hypertension Father, Mother. Melanoma Mother. Migraine Headache Mother.  Pregnancy / Birth History  Age at menarche 39 years. Contraceptive History Oral contraceptives. Gravida 3 Irregular periods Length (months) of breastfeeding 7-12 Maternal age 76-25 Para 2  Other Problems Asthma Back Pain Chest pain Gastroesophageal Reflux Disease Hemorrhoids Migraine Headache    Vitals  Weight: 170.6 lb Height: 65in Body Surface Area: 1.85 m Body Mass Index: 28.39 kg/m  Temp.: 97.1F(Temporal)  Pulse: 66 (Regular)  BP: 116/68 (Sitting, Left Arm, Standard)      Physical Exam  The physical exam findings are as follows: Note:WDWN in NAD Eyes: Pupils equal, round; sclera anicteric HENT: Oral mucosa moist; good dentition Neck: No masses palpated, no thyromegaly Breasts: right breast - firm fibrocystic changes throughout; no nipple retraction or nipple discharge; no dominant masses; no axillary lymphadenopathy left breast - firm fibrocystic changes throughout; left nipple seems to be more retracted - unable to visualize any discharge at this time; no dominant masses; no axillary lymphadenopathy; Lungs: CTA bilaterally; normal respiratory effort CV: Regular rate and rhythm; no murmurs; extremities  well-perfused with no edema Abd: +bowel sounds, soft, non-tender, no palpable organomegaly; no palpable hernias Skin: Warm, dry; no sign of jaundice Psychiatric - alert and oriented x 4; calm  mood and affect    Assessment & Plan   DISCHARGE FROM LEFT NIPPLE (N64.52)  Current Plans Schedule for Surgery - Left retroareolar lumpectomy. The surgical procedure has been discussed with the patient. Potential risks, benefits, alternative treatments, and expected outcomes have been explained. All of the patient's questions at this time have been answered. The likelihood of reaching the patient's treatment goal is good. The patient understand the proposed surgical procedure and wishes to proceed. Note:We will excise this 1.6 cm area of non-mass enhancement in the left retroareolar region which has become larger and enhances more intensely than in 2017. Malignancy cannot be excluded although this most likely represents periductal inflammatory changes.   Imogene Burn. Georgette Dover, MD, [facility] Beeper [phone]  06/13/2018 2:52 PM

## 2018-06-13 NOTE — Anesthesia Preprocedure Evaluation (Signed)
Anesthesia Evaluation  Patient identified by MRN, date of birth, ID band Patient awake    Reviewed: Allergy & Precautions, H&P , NPO status , Patient's Chart, lab work & pertinent test results  History of Anesthesia Complications (+) PONV  Airway Mallampati: II  TM Distance: >3 FB Neck ROM: full    Dental no notable dental hx. (+) Dental Advisory Given, Teeth Intact   Pulmonary neg pulmonary ROS, former smoker,    Pulmonary exam normal breath sounds clear to auscultation       Cardiovascular Exercise Tolerance: Good negative cardio ROS Normal cardiovascular exam Rhythm:regular Rate:Normal     Neuro/Psych  Headaches, negative psych ROS   GI/Hepatic negative GI ROS, Neg liver ROS,   Endo/Other  negative endocrine ROS  Renal/GU negative Renal ROS  negative genitourinary   Musculoskeletal   Abdominal   Peds  Hematology negative hematology ROS (+) hgb 10   Anesthesia Other Findings   Reproductive/Obstetrics negative OB ROS                             Anesthesia Physical  Anesthesia Plan  ASA: II  Anesthesia Plan: General   Post-op Pain Management:    Induction: Intravenous  PONV Risk Score and Plan: 4 or greater and Ondansetron, Dexamethasone, Midazolam and Scopolamine patch - Pre-op  Airway Management Planned: LMA  Additional Equipment:   Intra-op Plan:   Post-operative Plan: Extubation in OR  Informed Consent: I have reviewed the patients History and Physical, chart, labs and discussed the procedure including the risks, benefits and alternatives for the proposed anesthesia with the patient or authorized representative who has indicated his/her understanding and acceptance.   Dental Advisory Given  Plan Discussed with: CRNA and Surgeon  Anesthesia Plan Comments:         Anesthesia Quick Evaluation

## 2018-06-14 ENCOUNTER — Encounter (HOSPITAL_BASED_OUTPATIENT_CLINIC_OR_DEPARTMENT_OTHER): Payer: Self-pay | Admitting: Surgery

## 2018-06-21 ENCOUNTER — Ambulatory Visit: Payer: 59 | Admitting: Family Medicine

## 2018-06-21 ENCOUNTER — Encounter: Payer: Self-pay | Admitting: Family Medicine

## 2018-06-21 VITALS — BP 112/66 | HR 71 | Temp 98.5°F | Ht 65.0 in | Wt 174.0 lb

## 2018-06-21 DIAGNOSIS — L309 Dermatitis, unspecified: Secondary | ICD-10-CM | POA: Insufficient documentation

## 2018-06-21 DIAGNOSIS — R21 Rash and other nonspecific skin eruption: Secondary | ICD-10-CM

## 2018-06-21 MED ORDER — TRIAMCINOLONE ACETONIDE 0.5 % EX CREA: 1.0000 "application " | TOPICAL_CREAM | Freq: Two times a day (BID) | CUTANEOUS | 3 refills | Status: DC | PRN

## 2018-06-21 NOTE — Progress Notes (Signed)
Subjective:    Patient ID: Katie Espinoza, female    DOB: July 28, 1966, 52 y.o.   MRN: [id number]  HPI Here for a rash on chest/face and neck   Started a month ago  Middle of neck at first  Then face  Now face - going down into eyelids now  Itchy and uncomfortable   Zyrtec Benadryl Cortisone cream   Busy- mother had hip repl  She had lumpectomy last week (went well/no ca)   No new exposures  Stopped her oil of olay-no change  Soap -dove  Face- cerave hydrating cleanser   Laundry det - Tide  Uses dryer sheets   Eating more sugar lately   No new pets in the house   No recent travel  No exp to infections   Patient Active Problem List   Diagnosis Date Noted  . Rash and nonspecific skin eruption 06/21/2018  . Elevated blood sugar 03/29/2018  . Encounter for screening for HIV 03/23/2017  . Discharge from left nipple 05/01/2016  . S/P hysterectomy 05/04/2015  . Oral aphthous ulcer 07/02/2012  . Other screening mammogram 05/17/2012  . Anemia, iron deficiency 05/08/2011  . Family history of colon cancer 05/08/2011  . Encounter for routine gynecological examination 05/08/2011  . Routine general medical examination at a health care facility 05/01/2011  . 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 MIGRAINE HEADACHE 02/14/2007  . RHINITIS, ALLERGIC NEC 02/14/2007   Past Medical History:  Diagnosis Date  . Chronic constipation   . Heavy menstrual bleeding   . Iron deficiency anemia    IV Iron infusions--  last infusion 09-23-22016  . Migraine   . PONV (postoperative nausea and vomiting)   . Uterine fibroid   . Wears glasses    Past Surgical History:  Procedure Laterality Date  . ABDOMINAL HYSTERECTOMY    . BILATERAL SALPINGECTOMY Bilateral 05/04/2015   Procedure: BILATERAL SALPINGECTOMY;  Surgeon: Linda Hedges, DO;  Location: [facility];  Service: Gynecology;  Laterality: Bilateral;  . BREAST BIOPSY Left 2017  . BREAST BIOPSY Right   . BREAST LUMPECTOMY  Left 06/13/2018   Procedure: LEFT RETROAREOLAR  LUMPECTOMY;  Surgeon: Donnie Mesa, MD;  Location: [facility];  Service: General;  Laterality: Left;  . COLONOSCOPY  2007  . CYSTOSCOPY N/A 05/04/2015   Procedure: CYSTOSCOPY;  Surgeon: Linda Hedges, DO;  Location: [facility];  Service: Gynecology;  Laterality: N/A;  . DILATATION & CURETTAGE/HYSTEROSCOPY WITH TRUECLEAR N/A 10/22/2013   Procedure: DILATATION & CURETTAGE/HYSTEROSCOPY WITH TRUCLEAR;  Surgeon: Linda Hedges, DO;  Location: [facility];  Service: Gynecology;  Laterality: N/A;  . LAPAROSCOPIC ASSISTED VAGINAL HYSTERECTOMY N/A 05/04/2015   Procedure: LAPAROSCOPIC ASSISTED VAGINAL HYSTERECTOMY;  Surgeon: Linda Hedges, DO;  Location: [place];  Service: Gynecology;  Laterality: N/A;  need bed   Social History   Tobacco Use  . Smoking status: Former Smoker    Years: 2.00    Types: Cigarettes    Last attempt to quit: 07/31/1990    Years since quitting: 27.9  . Smokeless tobacco: Never Used  Substance Use Topics  . Alcohol use: Yes    Alcohol/week: 0.0 standard drinks    Comment: rare  . Drug use: No   Family History  Problem Relation Age of Onset  . Cancer Father        colon  . Colon cancer Father 2  . Hyperlipidemia Mother   . Colon cancer Paternal Uncle 48  . Esophageal cancer Neg Hx   .  Stomach cancer Neg Hx   . Rectal cancer Neg Hx    Allergies  Allergen Reactions  . Erythromycin Rash  . Penicillins Rash    06/13/18 received 2 Gms Ancef with no apparent reaction   Current Outpatient Medications on File Prior to Visit  Medication Sig Dispense Refill  . cetirizine (ZYRTEC) 10 MG tablet Take 10 mg by mouth daily.    Marland Kitchen docusate sodium (COLACE) 100 MG capsule Take 1 capsule (100 mg total) by mouth 2 (two) times daily. 60 capsule 1  . ibuprofen (ADVIL,MOTRIN) 800 MG tablet Take 1 tablet (800 mg total) by mouth every 8 (eight) hours as needed. 30 tablet 0   No current  facility-administered medications on file prior to visit.     Review of Systems  Constitutional: Negative for activity change, appetite change, fatigue, fever and unexpected weight change.  HENT: Negative for congestion, ear pain, rhinorrhea, sinus pressure and sore throat.   Eyes: Negative for pain, redness and visual disturbance.  Respiratory: Negative for cough, shortness of breath and wheezing.   Cardiovascular: Negative for chest pain and palpitations.  Gastrointestinal: Negative for abdominal pain, blood in stool, constipation and diarrhea.  Endocrine: Negative for polydipsia and polyuria.  Genitourinary: Negative for dysuria, frequency and urgency.  Musculoskeletal: Negative for arthralgias, back pain and myalgias.  Skin: Positive for rash. Negative for pallor.       Itchy rash  Allergic/Immunologic: Negative for environmental allergies.  Neurological: Negative for dizziness, syncope and headaches.  Hematological: Negative for adenopathy. Does not bruise/bleed easily.  Psychiatric/Behavioral: Negative for decreased concentration and dysphoric mood. The patient is not nervous/anxious.        Objective:   Physical Exam  Constitutional: She appears well-developed and well-nourished. No distress.  Well appearing   HENT:  Head: Normocephalic and atraumatic.  Nose: Nose normal.  Mouth/Throat: Oropharynx is clear and moist.  Eyes: Pupils are equal, round, and reactive to light. Conjunctivae and EOM are normal. Right eye exhibits no discharge. Left eye exhibits no discharge. No scleral icterus.  Neck: Normal range of motion. Neck supple.  Cardiovascular: Normal rate, regular rhythm and normal heart sounds.  Pulmonary/Chest: Effort normal and breath sounds normal. She has no wheezes.  Musculoskeletal: She exhibits no edema.  Lymphadenopathy:    She has no cervical adenopathy.  Neurological: She is alert.  Skin: Skin is warm and dry. Rash noted.  Patchy dry rash with scale on neck  and face  Resembles eczema  Pink in color Few excoriations on neck  No inv of hands/web spaces/axilla or groin  No lesions or rash seen in scalp  Psychiatric: She has a normal mood and affect.          Assessment & Plan:   Problem List Items Addressed This Visit      Musculoskeletal and Integument   Rash and nonspecific skin eruption - Primary    Patches with dry skin and scale-resembles eczema Adv to avoid products with color/fragrance  Also to moisturize/avoid hot water and conditions   Trial of triamcinolone cream 0.5% for all areas but eyelids (can use otc cort cream sparingly on upper lids only)  Handout given  Update if not starting to improve in a week or if worsening

## 2018-06-21 NOTE — Patient Instructions (Addendum)
I think this looks like and eczema type rash  Heat/cold/dry air can make it worse   Avoid hot water - use tepid water instead  Dove soap for sensitive skin  Scent free moisturizers   If you can - change detergent to tide free No more dryer sheets or fabric softeners   Use the triamcinolone cream on all areas but eyelids Stick to lighter strength (otc) cortisone for eyelids   Take antihistamine if needed  Stay cool  A vaporizer in bedroom is helpful to keep from being too dry   Update if not starting to improve in 1-2 weeks or if worsening

## 2018-06-21 NOTE — Assessment & Plan Note (Signed)
Patches with dry skin and scale-resembles eczema Adv to avoid products with color/fragrance  Also to moisturize/avoid hot water and conditions   Trial of triamcinolone cream 0.5% for all areas but eyelids (can use otc cort cream sparingly on upper lids only)  Handout given  Update if not starting to improve in a week or if worsening

## 2019-04-02 ENCOUNTER — Telehealth: Payer: Self-pay

## 2019-04-02 NOTE — Telephone Encounter (Signed)
I will see her then, thanks  

## 2019-04-02 NOTE — Telephone Encounter (Signed)
Pt's husband calling; pt already has appt with Dr Glori Bickers on 04/04/19 at 8 AM. This is first appt that was acceptable with pts schedule to come to office. Since 03/30/19 pt has had on and off  Mid chest pressure when swallows. No CP or SOB; no swelling in throat and no difficulty breathing. Pt has not had symptom before.pt has not had anything lodged in throat; no N&V.Mr Szymkowiak does not think pt needs to go to ED but does want to get checked out. pts husband said pt is working and Friday is first time pt could come in for appt. ED precautions given and Mr Pemble voiced understanding. FYI to Dr Glori Bickers.

## 2019-04-04 ENCOUNTER — Other Ambulatory Visit: Payer: Self-pay

## 2019-04-04 ENCOUNTER — Encounter: Payer: Self-pay | Admitting: Family Medicine

## 2019-04-04 ENCOUNTER — Ambulatory Visit: Payer: 59 | Admitting: Family Medicine

## 2019-04-04 VITALS — BP 102/62 | HR 72 | Temp 98.2°F | Ht 65.0 in | Wt 173.4 lb

## 2019-04-04 DIAGNOSIS — R079 Chest pain, unspecified: Secondary | ICD-10-CM

## 2019-04-04 DIAGNOSIS — R0789 Other chest pain: Secondary | ICD-10-CM | POA: Diagnosis not present

## 2019-04-04 NOTE — Progress Notes (Signed)
Subjective:    Patient ID: Katie Espinoza, female    DOB: 10/21/1965, 53 y.o.   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  Longer work hours   No clamminess or sweating or nausea   No heartburn as a rule  Avoids very spicy foods     Wt Readings from Last 3 Encounters:  04/04/19 173 lb 6 oz (78.6 kg)  06/21/18 174 lb (78.9 kg)  06/13/18 170 lb 10.2 oz (77.4 kg)  has gained some weight  28.85 kg/m   BP Readings from Last 3 Encounters:  04/04/19 102/62  06/21/18 112/66  06/13/18 129/75   Pulse Readings from Last 3 Encounters:  04/04/19 72  06/21/18 71  06/13/18 60    EKG today  Sinus bradycardia with rate of 58  No acute changes     Lab Results  Component Value Date   CHOL 176 03/22/2018   HDL 60.70 03/22/2018   LDLCALC 101 (H) 03/22/2018   LDLDIRECT 107.7 07/14/2013   TRIG 72.0 03/22/2018   CHOLHDL 3 03/22/2018    Patient Active Problem List   Diagnosis Date Noted  . Chest discomfort 04/04/2019  . Rash and nonspecific skin eruption 06/21/2018  . Elevated blood sugar 03/29/2018  . Encounter for screening for HIV 03/23/2017  . Discharge from left nipple 05/01/2016  . S/P hysterectomy 05/04/2015  . Oral aphthous ulcer 07/02/2012  . Other screening mammogram 05/17/2012  . Anemia, iron deficiency 05/08/2011  . Family history of colon cancer 05/08/2011  . Encounter for routine gynecological examination 05/08/2011  . Routine  general medical examination at a health care facility 05/01/2011  . 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 MIGRAINE HEADACHE 02/14/2007  . RHINITIS, ALLERGIC NEC 02/14/2007   Past Medical History:  Diagnosis Date  . Chronic constipation   . Heavy menstrual bleeding   . Iron deficiency anemia    IV Iron infusions--  last infusion 09-23-22016  . Migraine   . PONV (postoperative nausea and vomiting)   . Uterine fibroid   . Wears glasses    Past Surgical History:  Procedure Laterality Date  . ABDOMINAL HYSTERECTOMY    . BILATERAL SALPINGECTOMY Bilateral 05/04/2015   Procedure: BILATERAL SALPINGECTOMY;  Surgeon: Linda Hedges, DO;  Location: [facility];  Service: Gynecology;  Laterality: Bilateral;  . BREAST BIOPSY Left 2017  . BREAST BIOPSY Right   . BREAST LUMPECTOMY Left 06/13/2018   Procedure: LEFT RETROAREOLAR  LUMPECTOMY;  Surgeon: Donnie Mesa, MD;  Location: [facility];  Service: General;  Laterality: Left;  . COLONOSCOPY  2007  . CYSTOSCOPY N/A 05/04/2015   Procedure: CYSTOSCOPY;  Surgeon: Linda Hedges, DO;  Location: [facility];  Service: Gynecology;  Laterality: N/A;  . DILATATION & CURETTAGE/HYSTEROSCOPY WITH TRUECLEAR N/A 10/22/2013   Procedure: DILATATION & CURETTAGE/HYSTEROSCOPY WITH TRUCLEAR;  Surgeon: Linda Hedges, DO;  Location: [facility];  Service: Gynecology;  Laterality: N/A;  . LAPAROSCOPIC ASSISTED VAGINAL HYSTERECTOMY N/A 05/04/2015   Procedure: LAPAROSCOPIC ASSISTED VAGINAL HYSTERECTOMY;  Surgeon: Linda Hedges, DO;  Location: [facility];  Service: Gynecology;  Laterality: N/A;  need bed   Social History   Tobacco Use  . Smoking status: Former Smoker    Years: 2.00    Types: Cigarettes    Quit date: 07/31/1990    Years since quitting: 28.7  . Smokeless tobacco: Never Used  Substance Use Topics  . Alcohol use: Yes    Alcohol/week: 0.0 standard drinks    Comment: rare  . Drug use: No   Family History   Problem Relation Age of Onset  . Cancer Father        colon  . Colon cancer Father 4  . Hyperlipidemia Mother   . Colon cancer Paternal Uncle 81  . Esophageal cancer Neg Hx   . Stomach cancer Neg Hx   . Rectal cancer Neg Hx    Allergies  Allergen Reactions  . Erythromycin Rash  . Penicillins Rash    06/13/18 received 2 Gms Ancef with no apparent reaction   Current Outpatient Medications on File Prior to Visit  Medication Sig Dispense Refill  . cetirizine (ZYRTEC) 10 MG tablet Take 10 mg by mouth daily.    Marland Kitchen docusate sodium (COLACE) 100 MG capsule Take 1 capsule (100 mg total) by mouth 2 (two) times daily. 60 capsule 1  . ibuprofen (ADVIL,MOTRIN) 800 MG tablet Take 1 tablet (800 mg total) by mouth every 8 (eight) hours as needed. 30 tablet 0  . triamcinolone cream (KENALOG) 0.5 % Apply 1 application topically 2 (two) times daily as needed. To affected areas 30 g 3   No current facility-administered medications on file prior to visit.     Review of Systems  Constitutional: Positive for fatigue. Negative for activity change, appetite change, fever and unexpected weight change.  HENT: Negative for congestion, ear pain, rhinorrhea, sinus pressure and sore throat.   Eyes: Negative for pain, redness and visual disturbance.  Respiratory: Positive for chest tightness and shortness of breath. Negative for cough, choking, wheezing and stridor.   Cardiovascular: Negative for chest pain, palpitations and leg swelling.       No PND or orthopnea  Gastrointestinal: Negative for abdominal pain, blood in stool, constipation and diarrhea.  Endocrine: Negative for polydipsia and polyuria.  Genitourinary: Negative for dysuria, frequency and urgency.  Musculoskeletal: Negative for arthralgias, back pain and myalgias.  Skin: Negative for pallor and rash.  Allergic/Immunologic: Negative for environmental allergies.  Neurological: Negative for dizziness, syncope and headaches.  Hematological:  Negative for adenopathy. Does not bruise/bleed easily.  Psychiatric/Behavioral: Negative for decreased concentration and dysphoric mood. The patient is not nervous/anxious.        Objective:   Physical Exam Constitutional:      General: She is not in acute distress.    Appearance: She is well-developed and normal weight. She is not ill-appearing.  HENT:     Head: Normocephalic and atraumatic.  Eyes:     Conjunctiva/sclera: Conjunctivae normal.     Pupils: Pupils are equal, round, and reactive to light.  Neck:     Musculoskeletal: Normal range of motion and neck supple. No neck rigidity or muscular tenderness.     Thyroid: No thyromegaly.     Vascular: No carotid bruit or JVD.  Cardiovascular:     Rate and Rhythm: Normal  rate and regular rhythm.     Heart sounds: Normal heart sounds. No gallop.   Pulmonary:     Effort: Pulmonary effort is normal. No respiratory distress.     Breath sounds: Normal breath sounds. No wheezing or rales.     Comments: Good air exch No rales/rhonchi or crackles  Abdominal:     General: Bowel sounds are normal. There is no distension or abdominal bruit.     Palpations: Abdomen is soft. There is no mass.     Tenderness: There is no abdominal tenderness.  Musculoskeletal:     Right lower leg: No edema.     Left lower leg: No edema.     Comments: No LE tenderness or palp cords  Lymphadenopathy:     Cervical: No cervical adenopathy.  Skin:    General: Skin is warm and dry.     Findings: No rash.  Neurological:     Mental Status: She is alert. Mental status is at baseline.     Coordination: Coordination normal.     Gait: Gait normal.     Deep Tendon Reflexes: Reflexes are normal and symmetric. Reflexes normal.  Psychiatric:        Mood and Affect: Mood normal.           Assessment & Plan:   Problem List Items Addressed This Visit      Other   Chest discomfort - Primary    Atypical but with some sob and decreased exercise tolerance as  well  Reassuring exam/EKG Ref to cardiology to disc further eval  Enc healthy habits Inst to go to ED if symptoms persist or worsen after hours        Relevant Orders   Ambulatory referral to Cardiology    Other Visit Diagnoses    Chest pain, unspecified type       Relevant Orders   EKG 12-Lead (Completed)

## 2019-04-04 NOTE — Patient Instructions (Addendum)
Exam and EKG are re assuring  Let's refer you to cardiology for an evaluation   If symptoms suddenly worsen-please alert [place] /get to ER if necessary   Our office will you about the referral

## 2019-04-06 NOTE — Assessment & Plan Note (Signed)
Atypical but with some sob and decreased exercise tolerance as well  Reassuring exam/EKG Ref to cardiology to disc further eval  Enc healthy habits Inst to go to ED if symptoms persist or worsen after hours

## 2019-04-07 DIAGNOSIS — R0602 Shortness of breath: Secondary | ICD-10-CM | POA: Insufficient documentation

## 2019-04-07 NOTE — Progress Notes (Signed)
Cardiology Office Note  Date:  04/08/2019   ID:  Katie Espinoza, Katie Espinoza May 18, 1966, MRN [id number]  PCP:  Abner Greenspan, MD   Chief Complaint  Patient presents with  . OTHER    Chest discomfort and sob. Meds reviewed verbally with pt.    HPI:  Katie Espinoza is a 53 year old woman with past medical history of Former smoker, quit early 1990s,  Presents by referral from Dr. Glori Bickers for consultation of her chest pain and shortness of breath  Atypical chest pain reported, some shortness of breath, decreased exercise tolerance Seen by primary care, EKG normal  On follow-up today discussed her symptoms with her Recent jaw pain Later in day had nasuea, left chest pain Went home, layed down Again next day had left-sided chest pain Also reported having some back discomfort between her shoulder blades  She is concerned about SOB with exertion Used to walk 3 miles, now SOB with 3 blocks  She has been quarantined, weight trended upwards, Relatively inactive now back at work wearing mask  She is concerned about underlying coronary disease despite minimal risk factors  EKG personally reviewed by myself on todays visit Shows normal sinus rhythm with rate 66 bpm no significant ST or T wave changes   PMH:   has a past medical history of Chronic constipation, Heavy menstrual bleeding, Iron deficiency anemia, Migraine, PONV (postoperative nausea and vomiting), Uterine fibroid, and Wears glasses.  PSH:    Past Surgical History:  Procedure Laterality Date  . ABDOMINAL HYSTERECTOMY    . BILATERAL SALPINGECTOMY Bilateral 05/04/2015   Procedure: BILATERAL SALPINGECTOMY;  Surgeon: Linda Hedges, DO;  Location: [place];  Service: Gynecology;  Laterality: Bilateral;  . BREAST BIOPSY Left 2017  . BREAST BIOPSY Right   . BREAST LUMPECTOMY Left 06/13/2018   Procedure: LEFT RETROAREOLAR  LUMPECTOMY;  Surgeon: Donnie Mesa, MD;  Location: [place];  Service:  General;  Laterality: Left;  . COLONOSCOPY  2007  . CYSTOSCOPY N/A 05/04/2015   Procedure: CYSTOSCOPY;  Surgeon: Linda Hedges, DO;  Location: [facility];  Service: Gynecology;  Laterality: N/A;  . DILATATION & CURETTAGE/HYSTEROSCOPY WITH TRUECLEAR N/A 10/22/2013   Procedure: DILATATION & CURETTAGE/HYSTEROSCOPY WITH TRUCLEAR;  Surgeon: Linda Hedges, DO;  Location: [facility];  Service: Gynecology;  Laterality: N/A;  . LAPAROSCOPIC ASSISTED VAGINAL HYSTERECTOMY N/A 05/04/2015   Procedure: LAPAROSCOPIC ASSISTED VAGINAL HYSTERECTOMY;  Surgeon: Linda Hedges, DO;  Location: [place];  Service: Gynecology;  Laterality: N/A;  need bed    Current Outpatient Medications  Medication Sig Dispense Refill  . cetirizine (ZYRTEC) 10 MG tablet Take 10 mg by mouth daily.    Marland Kitchen docusate sodium (COLACE) 100 MG capsule Take 1 capsule (100 mg total) by mouth 2 (two) times daily. (Patient taking differently: Take 100 mg by mouth daily. ) 60 capsule 1  . ibuprofen (ADVIL,MOTRIN) 800 MG tablet Take 1 tablet (800 mg total) by mouth every 8 (eight) hours as needed. 30 tablet 0  . triamcinolone cream (KENALOG) 0.5 % Apply 1 application topically 2 (two) times daily as needed. To affected areas 30 g 3   No current facility-administered medications for this visit.      Allergies:   Erythromycin and Penicillins   Social History:  The patient  reports that she quit smoking about 28 years ago. Her smoking use included cigarettes. She quit after 2.00 years of use. She has never used smokeless tobacco. She reports current alcohol use. She  reports that she does not use drugs.   Family History:   family history includes Cancer in her father; Colon cancer (age of onset: 49) in her paternal uncle; Colon cancer (age of onset: 21) in her father; Heart Problems in her maternal grandfather; Hyperlipidemia in her mother.    Review of Systems: Review of Systems  Constitutional: Negative.   HENT:  Negative.   Respiratory: Positive for shortness of breath.   Cardiovascular: Positive for chest pain.  Gastrointestinal: Negative.   Musculoskeletal: Negative.   Neurological: Negative.   Psychiatric/Behavioral: Negative.   All other systems reviewed and are negative.   PHYSICAL EXAM: VS:  BP 114/78 (BP Location: Right Arm, Patient Position: Sitting, Cuff Size: Normal)   Pulse 66   Wt 178 lb (80.7 kg)   LMP 04/26/2015   BMI 29.62 kg/m  , BMI Body mass index is 29.62 kg/m. GEN: Well nourished, well developed, in no acute distress HEENT: normal Neck: no JVD, carotid bruits, or masses Cardiac: RRR; no murmurs, rubs, or gallops,no edema  Respiratory:  clear to auscultation bilaterally, normal work of breathing GI: soft, nontender, nondistended, + BS MS: no deformity or atrophy Skin: warm and dry, no rash Neuro:  Strength and sensation are intact Psych: euthymic mood, full affect   Recent Labs: No results found for requested labs within last 8760 hours.    Lipid Panel Lab Results  Component Value Date   CHOL 176 03/22/2018   HDL 60.70 03/22/2018   LDLCALC 101 (H) 03/22/2018   TRIG 72.0 03/22/2018      Wt Readings from Last 3 Encounters:  04/08/19 178 lb (80.7 kg)  04/04/19 173 lb 6 oz (78.6 kg)  06/21/18 174 lb (78.9 kg)       ASSESSMENT AND PLAN:  Problem List Items Addressed This Visit    Shortness of breath   Relevant Orders   EKG 12-Lead    Other Visit Diagnoses    Chest pain, unspecified type       Relevant Orders   CT CARDIAC SCORING     Chest pain Atypical in nature, more concerning for musculoskeletal disorder given it radiates around to her back between her shoulder blades Possible rib issue Though given her concerned, discussed risk stratification studies for cardiac issues We recommended CT coronary calcium scoring This would likely be higher yield than a stress test Discussed possible scores and intervention that would be needed if  elevated  Shortness of breath Calcium score as above No signs of fluid overload Few risk factors for cardiac disease Suspect symptoms could be secondary to recent weight gain, deconditioning, wearing a mask, walking in the heat We did offer echocardiogram if symptoms get worse We will start with calcium score as above  Disposition:   F/U as needed  Patient was seen in consultation for Dr. Glori Bickers and will be referred back to her office for ongoing care of the issues detailed above   Total encounter time more than 60 minutes  Greater than 50% was spent in counseling and coordination of care with the patient    Signed, Esmond Plants, M.D., Ph.D. [place], [place]

## 2019-04-08 ENCOUNTER — Ambulatory Visit (INDEPENDENT_AMBULATORY_CARE_PROVIDER_SITE_OTHER): Payer: 59 | Admitting: Cardiovascular Disease

## 2019-04-08 ENCOUNTER — Other Ambulatory Visit: Payer: Self-pay

## 2019-04-08 ENCOUNTER — Encounter: Payer: Self-pay | Admitting: Cardiovascular Disease

## 2019-04-08 DIAGNOSIS — R0602 Shortness of breath: Secondary | ICD-10-CM | POA: Diagnosis not present

## 2019-04-08 DIAGNOSIS — R079 Chest pain, unspecified: Secondary | ICD-10-CM

## 2019-04-08 NOTE — Patient Instructions (Addendum)
We will order a CT coronary calcium score for chest pain  Medication Instructions:  No changes  If you need a refill on your cardiac medications before your next appointment, please call your pharmacy.    Lab work: No new labs needed   If you have labs (blood work) drawn today and your tests are completely normal, you will receive your results only by: Marland Kitchen MyChart Message (if you have MyChart) OR . A paper copy in the mail If you have any lab test that is abnormal or we need to change your treatment, we will call you to review the results.   Testing/Procedures:  We will order CT coronary calcium score $150 Family History     Please call [phone] to schedule   [facility] [address], [place] [address]    Follow-Up: At [facility], you and your health needs are our priority.  As part of our continuing mission to provide you with exceptional heart care, we have created designated Provider Care Teams.  These Care Teams include your primary Cardiologist (physician) and Advanced Practice Providers (APPs -  Physician Assistants and Nurse Practitioners) who all work together to provide you with the care you need, when you need it.  . You will need a follow up appointment as needed  . Providers on your designated Care Team:   . Murray Hodgkins, NP . Christell Faith, PA-C . Marrianne Mood, PA-C  Any Other Special Instructions Will Be Listed Below (If Applicable).  For educational health videos Log in to : [email or web address] Or : [email or web address], password : [email or web address]

## 2019-04-23 ENCOUNTER — Telehealth: Payer: Self-pay

## 2019-04-23 NOTE — Telephone Encounter (Signed)
Left detailed VM w COVID screen and back door lab info and front door info   

## 2019-04-24 ENCOUNTER — Telehealth: Payer: Self-pay | Admitting: Family Medicine

## 2019-04-24 DIAGNOSIS — R739 Hyperglycemia, unspecified: Secondary | ICD-10-CM

## 2019-04-24 DIAGNOSIS — Z0000 Encounter for general adult medical examination without abnormal findings: Secondary | ICD-10-CM

## 2019-04-24 NOTE — Telephone Encounter (Signed)
-----   Message from Ellamae Sia sent at 04/15/2019  3:04 PM EDT ----- Regarding: Lab orders for Friday, 9.25.20 Patient is scheduled for CPX labs, please order future labs, Thanks , Karna Christmas

## 2019-04-25 ENCOUNTER — Other Ambulatory Visit: Payer: Self-pay

## 2019-04-25 ENCOUNTER — Other Ambulatory Visit (INDEPENDENT_AMBULATORY_CARE_PROVIDER_SITE_OTHER): Payer: 59

## 2019-04-25 ENCOUNTER — Telehealth: Payer: Self-pay

## 2019-04-25 DIAGNOSIS — R739 Hyperglycemia, unspecified: Secondary | ICD-10-CM

## 2019-04-25 DIAGNOSIS — Z114 Encounter for screening for human immunodeficiency virus [HIV]: Secondary | ICD-10-CM

## 2019-04-25 DIAGNOSIS — Z0000 Encounter for general adult medical examination without abnormal findings: Secondary | ICD-10-CM | POA: Diagnosis not present

## 2019-04-25 LAB — CBC WITH DIFFERENTIAL/PLATELET
Basophils Absolute: 0 10*3/uL (ref 0.0–0.1)
Basophils Relative: 0.7 % (ref 0.0–3.0)
Eosinophils Absolute: 0.1 10*3/uL (ref 0.0–0.7)
Eosinophils Relative: 1.8 % (ref 0.0–5.0)
HCT: 40.1 % (ref 36.0–46.0)
Hemoglobin: 13.1 g/dL (ref 12.0–15.0)
Lymphocytes Relative: 23.2 % (ref 12.0–46.0)
Lymphs Abs: 1.1 10*3/uL (ref 0.7–4.0)
MCHC: 32.8 g/dL (ref 30.0–36.0)
MCV: 87.2 fl (ref 78.0–100.0)
Monocytes Absolute: 0.4 10*3/uL (ref 0.1–1.0)
Monocytes Relative: 7.9 % (ref 3.0–12.0)
Neutro Abs: 3.1 10*3/uL (ref 1.4–7.7)
Neutrophils Relative %: 66.4 % (ref 43.0–77.0)
Platelets: 235 10*3/uL (ref 150.0–400.0)
RBC: 4.6 Mil/uL (ref 3.87–5.11)
RDW: 13.1 % (ref 11.5–15.5)
WBC: 4.7 10*3/uL (ref 4.0–10.5)

## 2019-04-25 LAB — LIPID PANEL
Cholesterol: 170 mg/dL (ref 0–200)
HDL: 56.6 mg/dL (ref >39.00)
LDL Cholesterol: 102 mg/dL — ABNORMAL HIGH (ref 0–99)
NonHDL: 113.88
Total CHOL/HDL Ratio: 3
Triglycerides: 60 mg/dL (ref 0.0–149.0)
VLDL: 12 mg/dL (ref 0.0–40.0)

## 2019-04-25 LAB — COMPREHENSIVE METABOLIC PANEL
ALT: 20 U/L (ref 0–35)
AST: 20 U/L (ref 0–37)
Albumin: 4.5 g/dL (ref 3.5–5.2)
Alkaline Phosphatase: 58 U/L (ref 39–117)
BUN: 17 mg/dL (ref 6–23)
CO2: 27 mEq/L (ref 19–32)
Calcium: 9.6 mg/dL (ref 8.4–10.5)
Chloride: 103 mEq/L (ref 96–112)
Creatinine, Ser: 0.86 mg/dL (ref 0.40–1.20)
GFR: 69.24 mL/min (ref >60.00)
Glucose, Bld: 94 mg/dL (ref 70–99)
Potassium: 4.5 mEq/L (ref 3.5–5.1)
Sodium: 139 mEq/L (ref 135–145)
Total Bilirubin: 0.4 mg/dL (ref 0.2–1.2)
Total Protein: 6.6 g/dL (ref 6.0–8.3)

## 2019-04-25 LAB — HEMOGLOBIN A1C: Hgb A1c MFr Bld: 6.1 % (ref 4.6–6.5)

## 2019-04-25 LAB — TSH: TSH: 1.55 u[IU]/mL (ref 0.35–4.50)

## 2019-04-25 NOTE — Telephone Encounter (Signed)
Pt was seen for lab only appt and requested for HIV screening... lab was ordered

## 2019-04-28 LAB — HIV ANTIBODY (ROUTINE TESTING W REFLEX): HIV 1&2 Ab, 4th Generation: NONREACTIVE

## 2019-05-02 ENCOUNTER — Encounter: Payer: 59 | Admitting: Family Medicine

## 2019-05-09 ENCOUNTER — Encounter: Payer: 59 | Admitting: Family Medicine

## 2019-05-16 ENCOUNTER — Other Ambulatory Visit: Payer: Self-pay

## 2019-05-16 ENCOUNTER — Ambulatory Visit (INDEPENDENT_AMBULATORY_CARE_PROVIDER_SITE_OTHER)
Admission: RE | Admit: 2019-05-16 | Discharge: 2019-05-16 | Disposition: A | Discharge status: Home / Self Care | Payer: Self-pay | Source: Ambulatory Visit | Attending: Cardiovascular Disease | Admitting: Cardiovascular Disease

## 2019-05-16 DIAGNOSIS — R079 Chest pain, unspecified: Secondary | ICD-10-CM

## 2019-06-06 ENCOUNTER — Other Ambulatory Visit: Payer: Self-pay

## 2019-06-06 ENCOUNTER — Encounter: Payer: Self-pay | Admitting: Family Medicine

## 2019-06-06 ENCOUNTER — Ambulatory Visit (INDEPENDENT_AMBULATORY_CARE_PROVIDER_SITE_OTHER): Payer: 59 | Admitting: Family Medicine

## 2019-06-06 VITALS — BP 108/66 | HR 62 | Temp 97.5°F | Ht 65.0 in | Wt 176.3 lb

## 2019-06-06 DIAGNOSIS — R739 Hyperglycemia, unspecified: Secondary | ICD-10-CM | POA: Diagnosis not present

## 2019-06-06 DIAGNOSIS — Z0000 Encounter for general adult medical examination without abnormal findings: Secondary | ICD-10-CM

## 2019-06-06 NOTE — Patient Instructions (Addendum)
Don't forget to schedule your mammogram  See your surgeon if needed   The new shingles vaccine is called shingrix  Check with insurance  If covered - we have it here - call us to schedule   Try to get most of your carbohydrates from produce (with the exception of white potatoes)  Eat less bread/pasta/rice/snack foods/cereals/sweets and other items from the middle of the grocery store (processed carbs)

## 2019-06-06 NOTE — Progress Notes (Signed)
Subjective:    Patient ID: Katie Espinoza, female    DOB: [birth year], 53 y.o.   MRN: [id number]  HPI Here for health maintenance exam and to review chronic medical problems    Feeling good  Taking fair care of herself   Wt Readings from Last 3 Encounters:  06/06/19 176 lb 5 oz (80 kg)  04/08/19 178 lb (80.7 kg)  04/04/19 173 lb 6 oz (78.6 kg)  has lost and gained a bit  She is trying to stay hydrated and keep wt down  29.34 kg/m   Drinking lots of water  Recently=has had special events and poor eating Gets back on track  Husband's diet has to change   Pap 7/16-nl /gyn Had a hysterectomy No longer goes to gyn    Mammogram 9/19 -addn views then lumpectomy  Was b9  Has some tenderness around nipple recently  Is due for her mammogram - then covid hit  Self breast exam - no lumps/ just tenderness   Flu vaccine -does not get -they make her sick/side eff Gets a bad local reaction   Td 6/11  Colonoscopy 4/19  5 y recall  Father had colon cancer   Zoster status -never had shingles  She may be interested in shingrix   BP Readings from Last 3 Encounters:  06/06/19 108/66  04/08/19 114/78  04/04/19 102/62   Pulse Readings from Last 3 Encounters:  06/06/19 62  04/08/19 66  04/04/19 72   HIV screen non reactive  Cholesterol Lab Results  Component Value Date   CHOL 170 04/25/2019   CHOL 176 03/22/2018   CHOL 133 03/20/2017   Lab Results  Component Value Date   HDL 56.60 04/25/2019   HDL 60.70 03/22/2018   HDL 48.10 03/20/2017   Lab Results  Component Value Date   LDLCALC 102 (H) 04/25/2019   LDLCALC 101 (H) 03/22/2018   LDLCALC 73 03/20/2017   Lab Results  Component Value Date   TRIG 60.0 04/25/2019   TRIG 72.0 03/22/2018   TRIG 57.0 03/20/2017   Lab Results  Component Value Date   CHOLHDL 3 04/25/2019   CHOLHDL 3 03/22/2018   CHOLHDL 3 03/20/2017   Lab Results  Component Value Date   LDLDIRECT 107.7 07/14/2013  good cholesterol profile    Other labs Results for orders placed or performed in visit on 04/25/19  HIV antibody (with reflex)  Result Value Ref Range   HIV 1&2 Ab, 4th Generation NON-REACTIVE NON-REACTI  TSH  Result Value Ref Range   TSH 1.55 0.35 - 4.50 uIU/mL  Lipid panel  Result Value Ref Range   Cholesterol 170 0 - 200 mg/dL   Triglycerides 60.0 0.0 - 149.0 mg/dL   HDL 56.60 >39.00 mg/dL   VLDL 12.0 0.0 - 40.0 mg/dL   LDL Cholesterol 102 (H) 0 - 99 mg/dL   Total CHOL/HDL Ratio 3    NonHDL 113.88   Hemoglobin A1c  Result Value Ref Range   Hgb A1c MFr Bld 6.1 4.6 - 6.5 %  Comprehensive metabolic panel  Result Value Ref Range   Sodium 139 135 - 145 mEq/L   Potassium 4.5 3.5 - 5.1 mEq/L   Chloride 103 96 - 112 mEq/L   CO2 27 19 - 32 mEq/L   Glucose, Bld 94 70 - 99 mg/dL   BUN 17 6 - 23 mg/dL   Creatinine, Ser 0.86 0.40 - 1.20 mg/dL   Total Bilirubin 0.4 0.2 - 1.2 mg/dL   Alkaline  Phosphatase 58 39 - 117 U/L   AST 20 0 - 37 U/L   ALT 20 0 - 35 U/L   Total Protein 6.6 6.0 - 8.3 g/dL   Albumin 4.5 3.5 - 5.2 g/dL   Calcium 9.6 8.4 - 10.5 mg/dL   GFR 69.24 >60.00 mL/min  CBC with Differential/Platelet  Result Value Ref Range   WBC 4.7 4.0 - 10.5 K/uL   RBC 4.60 3.87 - 5.11 Mil/uL   Hemoglobin 13.1 12.0 - 15.0 g/dL   HCT 40.1 36.0 - 46.0 %   MCV 87.2 78.0 - 100.0 fl   MCHC 32.8 30.0 - 36.0 g/dL   RDW 13.1 11.5 - 15.5 %   Platelets 235.0 150.0 - 400.0 K/uL   Neutrophils Relative % 66.4 43.0 - 77.0 %   Lymphocytes Relative 23.2 12.0 - 46.0 %   Monocytes Relative 7.9 3.0 - 12.0 %   Eosinophils Relative 1.8 0.0 - 5.0 %   Basophils Relative 0.7 0.0 - 3.0 %   Neutro Abs 3.1 1.4 - 7.7 K/uL   Lymphs Abs 1.1 0.7 - 4.0 K/uL   Monocytes Absolute 0.4 0.1 - 1.0 K/uL   Eosinophils Absolute 0.1 0.0 - 0.7 K/uL   Basophils Absolute 0.0 0.0 - 0.1 K/uL     Patient Active Problem List   Diagnosis Date Noted   Shortness of breath 04/07/2019   Chest discomfort 04/04/2019   Rash and nonspecific skin  eruption 06/21/2018   Elevated blood sugar 03/29/2018   Encounter for screening for HIV 03/23/2017   Discharge from left nipple 05/01/2016   S/P hysterectomy 05/04/2015   Oral aphthous ulcer 07/02/2012   Other screening mammogram 05/17/2012   Anemia, iron deficiency 05/08/2011   Family history of colon cancer 05/08/2011   Encounter for routine gynecological examination 05/08/2011   Routine general medical examination at a health care facility 05/01/2011   ACTINIC KERATOSIS 07/26/2010   Chest pain of uncertain etiology 123XX123   MIGRAINE HEADACHE 02/14/2007   RHINITIS, ALLERGIC NEC 02/14/2007   Past Medical History:  Diagnosis Date   Chronic constipation    Heavy menstrual bleeding    Iron deficiency anemia    IV Iron infusions--  last infusion 09-23-22016   Migraine    PONV (postoperative nausea and vomiting)    Uterine fibroid    Wears glasses    Past Surgical History:  Procedure Laterality Date   ABDOMINAL HYSTERECTOMY     BILATERAL SALPINGECTOMY Bilateral 05/04/2015   Procedure: BILATERAL SALPINGECTOMY;  Surgeon: Linda Hedges, DO;  Location: [place];  Service: Gynecology;  Laterality: Bilateral;   BREAST BIOPSY Left 2017   BREAST BIOPSY Right    BREAST LUMPECTOMY Left 06/13/2018   Procedure: LEFT RETROAREOLAR  LUMPECTOMY;  Surgeon: Donnie Mesa, MD;  Location: [place];  Service: General;  Laterality: Left;   COLONOSCOPY  2007   CYSTOSCOPY N/A 05/04/2015   Procedure: CYSTOSCOPY;  Surgeon: Linda Hedges, DO;  Location: [facility];  Service: Gynecology;  Laterality: N/A;   DILATATION & CURETTAGE/HYSTEROSCOPY WITH TRUECLEAR N/A 10/22/2013   Procedure: DILATATION & CURETTAGE/HYSTEROSCOPY WITH TRUCLEAR;  Surgeon: Linda Hedges, DO;  Location: [facility];  Service: Gynecology;  Laterality: N/A;   LAPAROSCOPIC ASSISTED VAGINAL HYSTERECTOMY N/A 05/04/2015   Procedure: LAPAROSCOPIC ASSISTED VAGINAL  HYSTERECTOMY;  Surgeon: Linda Hedges, DO;  Location: [facility];  Service: Gynecology;  Laterality: N/A;  need bed   Social History   Tobacco Use   Smoking status: Former Smoker  Years: 2.00    Types: Cigarettes    Quit date: 07/31/1990    Years since quitting: 28.8   Smokeless tobacco: Never Used  Substance Use Topics   Alcohol use: Yes    Alcohol/week: 0.0 standard drinks    Comment: rare   Drug use: No   Family History  Problem Relation Age of Onset   Cancer Father        colon   Colon cancer Father 59   Hyperlipidemia Mother    Colon cancer Paternal Uncle 87   Heart Problems Maternal Grandfather    Esophageal cancer Neg Hx    Stomach cancer Neg Hx    Rectal cancer Neg Hx    Allergies  Allergen Reactions   Erythromycin Rash   Penicillins Rash    06/13/18 received 2 Gms Ancef with no apparent reaction   Current Outpatient Medications on File Prior to Visit  Medication Sig Dispense Refill   cetirizine (ZYRTEC) 10 MG tablet Take 10 mg by mouth daily.     docusate sodium (COLACE) 100 MG capsule Take 1 capsule (100 mg total) by mouth 2 (two) times daily. (Patient taking differently: Take 100 mg by mouth daily. ) 60 capsule 1   ibuprofen (ADVIL,MOTRIN) 800 MG tablet Take 1 tablet (800 mg total) by mouth every 8 (eight) hours as needed. 30 tablet 0   No current facility-administered medications on file prior to visit.      Review of Systems  Constitutional: Negative for activity change, appetite change, fatigue, fever and unexpected weight change.  HENT: Negative for congestion, ear pain, rhinorrhea, sinus pressure and sore throat.   Eyes: Negative for pain, redness and visual disturbance.  Respiratory: Negative for cough, shortness of breath and wheezing.   Cardiovascular: Negative for chest pain and palpitations.  Gastrointestinal: Negative for abdominal pain, blood in stool, constipation and diarrhea.  Endocrine: Negative for  polydipsia and polyuria.  Genitourinary: Negative for dysuria, frequency and urgency.  Musculoskeletal: Negative for arthralgias, back pain and myalgias.  Skin: Negative for pallor and rash.  Allergic/Immunologic: Negative for environmental allergies.  Neurological: Negative for dizziness, syncope and headaches.  Hematological: Negative for adenopathy. Does not bruise/bleed easily.  Psychiatric/Behavioral: Negative for decreased concentration and dysphoric mood. The patient is not nervous/anxious.        Objective:   Physical Exam Constitutional:      General: She is not in acute distress.    Appearance: Normal appearance. She is well-developed and normal weight. She is not ill-appearing or diaphoretic.     Comments: overwt  HENT:     Head: Normocephalic and atraumatic.     Right Ear: Tympanic membrane, ear canal and external ear normal.     Left Ear: Tympanic membrane, ear canal and external ear normal.     Nose: Nose normal. No congestion.     Mouth/Throat:     Mouth: Mucous membranes are moist.     Pharynx: Oropharynx is clear. No posterior oropharyngeal erythema.  Eyes:     General: No scleral icterus.    Extraocular Movements: Extraocular movements intact.     Conjunctiva/sclera: Conjunctivae normal.     Pupils: Pupils are equal, round, and reactive to light.  Neck:     Musculoskeletal: Normal range of motion and neck supple. No neck rigidity or muscular tenderness.     Thyroid: No thyromegaly.     Vascular: No carotid bruit or JVD.  Cardiovascular:     Rate and Rhythm: Normal rate and regular rhythm.  Pulses: Normal pulses.     Heart sounds: Normal heart sounds. No gallop.   Pulmonary:     Effort: Pulmonary effort is normal. No respiratory distress.     Breath sounds: Normal breath sounds. No wheezing.     Comments: Good air exch Chest:     Chest wall: No tenderness.  Abdominal:     General: Bowel sounds are normal. There is no distension or abdominal bruit.      Palpations: Abdomen is soft. There is no mass.     Tenderness: There is no abdominal tenderness.     Hernia: No hernia is present.  Genitourinary:    Comments: Breast exam: No mass, nodules, thickening, tenderness, bulging, retraction, inflamation, nipple discharge or skin changes noted.  No axillary or clavicular LA.      Lumpectomy scar noted Musculoskeletal: Normal range of motion.        General: No tenderness.     Right lower leg: No edema.     Left lower leg: No edema.  Lymphadenopathy:     Cervical: No cervical adenopathy.  Skin:    General: Skin is warm and dry.     Coloration: Skin is not pale.     Findings: No erythema or rash.     Comments: Solar lentigines diffusely   Neurological:     Mental Status: She is alert. Mental status is at baseline.     Cranial Nerves: No cranial nerve deficit.     Motor: No abnormal muscle tone.     Coordination: Coordination normal.     Gait: Gait normal.     Deep Tendon Reflexes: Reflexes are normal and symmetric.  Psychiatric:        Mood and Affect: Mood normal.            Assessment & Plan:   Problem List Items Addressed This Visit      Other   Routine general medical examination at a health care facility - Primary    Reviewed health habits including diet and exercise and skin cancer prevention Reviewed appropriate screening tests for age  Also reviewed health mt list, fam hx and immunization status , as well as social and family history   See HPI Labs reviewed  Declines flu shot (reaction in the past)  She plans to schedule her mammogram (s/p b9 bx from last year)  Good cholesterol profile Enc good diet and exercise  Discussed the shingrix vaccine and she will check on coverage      Elevated blood sugar    Lab Results  Component Value Date   HGBA1C 6.1 04/25/2019   Will watch this  disc imp of low glycemic diet and wt loss to prevent DM2

## 2019-06-08 NOTE — Assessment & Plan Note (Signed)
Lab Results  Component Value Date   HGBA1C 6.1 04/25/2019   Will watch this  disc imp of low glycemic diet and wt loss to prevent DM2

## 2019-06-08 NOTE — Assessment & Plan Note (Addendum)
Reviewed health habits including diet and exercise and skin cancer prevention Reviewed appropriate screening tests for age  Also reviewed health mt list, fam hx and immunization status , as well as social and family history   See HPI Labs reviewed  Declines flu shot (reaction in the past)  She plans to schedule her mammogram (s/p b9 bx from last year)  Good cholesterol profile Enc good diet and exercise  Discussed the shingrix vaccine and she will check on coverage

## 2019-06-12 ENCOUNTER — Telehealth: Payer: Self-pay | Admitting: Family Medicine

## 2019-06-12 DIAGNOSIS — M79671 Pain in right foot: Secondary | ICD-10-CM

## 2019-06-12 NOTE — Telephone Encounter (Signed)
Pt req ref to podiatry  Will route to [facility] to call her

## 2019-06-12 NOTE — Telephone Encounter (Signed)
-----   Message from Abner Greenspan, MD sent at 06/06/2019 11:17 AM EST ----- Some foot pain - R foot base of 2nd toe  Wants pod ref Do ref next week

## 2019-06-13 ENCOUNTER — Encounter: Payer: Self-pay | Admitting: Family Medicine

## 2019-06-20 ENCOUNTER — Ambulatory Visit: Payer: Self-pay | Admitting: Podiatry

## 2019-06-20 ENCOUNTER — Ambulatory Visit (INDEPENDENT_AMBULATORY_CARE_PROVIDER_SITE_OTHER): Payer: 59

## 2019-06-20 ENCOUNTER — Other Ambulatory Visit: Payer: Self-pay

## 2019-06-20 DIAGNOSIS — M778 Other enthesopathies, not elsewhere classified: Secondary | ICD-10-CM

## 2019-06-20 DIAGNOSIS — M79672 Pain in left foot: Secondary | ICD-10-CM

## 2019-06-20 DIAGNOSIS — M21962 Unspecified acquired deformity of left lower leg: Secondary | ICD-10-CM

## 2019-06-20 DIAGNOSIS — M7751 Other enthesopathy of right foot: Secondary | ICD-10-CM

## 2019-06-20 DIAGNOSIS — M21961 Unspecified acquired deformity of right lower leg: Secondary | ICD-10-CM

## 2019-06-20 DIAGNOSIS — M7752 Other enthesopathy of left foot: Secondary | ICD-10-CM

## 2019-07-08 NOTE — Progress Notes (Signed)
  Subjective:  Patient ID: Katie Espinoza, female    DOB: 29-Mar-1966,  MRN: [id number]  Chief Complaint  Patient presents with  . Foot Pain    Pt states left 2nd digit interspace/dorsal foot pain, 5-6 months duration, no known injury, pain when digit is flexed.  . Foot Pain    Left plantar heel pain, no known injury.  . Plantar Warts    Bilateral plantar warts on plantar forefeet, 5 years duration.    53 y.o. female presents with the above complaint.   Review of Systems: Negative except as noted in the HPI. Denies N/V/F/Ch.  Past Medical History:  Diagnosis Date  . Chronic constipation   . Heavy menstrual bleeding   . Iron deficiency anemia    IV Iron infusions--  last infusion 09-23-22016  . Migraine   . PONV (postoperative nausea and vomiting)   . Uterine fibroid   . Wears glasses     Current Outpatient Medications:  .  cetirizine (ZYRTEC) 10 MG tablet, Take 10 mg by mouth daily., Disp: , Rfl:  .  docusate sodium (COLACE) 100 MG capsule, Take 1 capsule (100 mg total) by mouth 2 (two) times daily. (Patient taking differently: Take 100 mg by mouth daily. ), Disp: 60 capsule, Rfl: 1 .  ibuprofen (ADVIL,MOTRIN) 800 MG tablet, Take 1 tablet (800 mg total) by mouth every 8 (eight) hours as needed., Disp: 30 tablet, Rfl: 0  Social History   Tobacco Use  Smoking Status Former Smoker  . Years: 2.00  . Types: Cigarettes  . Quit date: 07/31/1990  . Years since quitting: 29.5  Smokeless Tobacco Never Used    Allergies  Allergen Reactions  . Erythromycin Rash  . Penicillins Rash    06/13/18 received 2 Gms Ancef with no apparent reaction   Objective:  There were no vitals filed for this visit. There is no height or weight on file to calculate BMI. Constitutional Well developed. Well nourished.  Vascular Dorsalis pedis pulses palpable bilaterally. Posterior tibial pulses palpable bilaterally. Capillary refill normal to all digits.  No cyanosis or clubbing noted. Pedal  hair growth normal.  Neurologic Normal speech. Oriented to person, place, and time. Epicritic sensation to light touch grossly present bilaterally.  Dermatologic Nails well groomed and normal in appearance. No open wounds. No skin lesions.  Orthopedic: pain plantar left second metatarsalphalangeal joint, callus formation.   Radiographs: Taken and reviewed no acute fractures or dislocations Assessment:   1. Capsulitis of metatarsophalangeal (MTP) joints of both feet   2. Metatarsal deformity, left   3. Metatarsal deformity, right    Plan:  Patient was evaluated and treated and all questions answered.  Capsulitis Bilat Feet -XR taken and reviewed -Injection delivered to the 2nd MPJ bilat  Procedure: Joint Injection Location: Bilateral 2nd MP joint Skin Prep: Alcohol. Injectate: 0.5 cc 1% lidocaine plain, 0.5 cc dexamethasone phosphate. Disposition: Patient tolerated procedure well. Injection site dressed with a band-aid.    Return in about 6 weeks (around 08/01/2019) for Capsulitis.

## 2019-07-14 ENCOUNTER — Other Ambulatory Visit: Payer: Self-pay | Admitting: Podiatry

## 2019-07-14 DIAGNOSIS — M778 Other enthesopathies, not elsewhere classified: Secondary | ICD-10-CM

## 2019-07-31 ENCOUNTER — Ambulatory Visit: Payer: 59 | Admitting: Podiatry

## 2019-07-31 ENCOUNTER — Encounter: Payer: Self-pay | Admitting: Podiatry

## 2019-07-31 ENCOUNTER — Other Ambulatory Visit: Payer: Self-pay

## 2019-07-31 DIAGNOSIS — M2042 Other hammer toe(s) (acquired), left foot: Secondary | ICD-10-CM

## 2019-07-31 DIAGNOSIS — M7752 Other enthesopathy of left foot: Secondary | ICD-10-CM | POA: Diagnosis not present

## 2019-07-31 DIAGNOSIS — M21962 Unspecified acquired deformity of left lower leg: Secondary | ICD-10-CM

## 2019-07-31 NOTE — Progress Notes (Signed)
  Subjective:  Patient ID: Katie Espinoza, female    DOB: Nov 23, 1965,  MRN: [id number]  Chief Complaint  Patient presents with  . Foot Pain    pt is here for a f/u of capsulitis to the right, pt states that the left foot, pt states that the injection she recieved last time, has lasted for about 3 weeks, pt is also looking to know how to keep her callus trimmed as well    53 y.o. female presents with the above complaint. Hx confirmed with patient. Objective:  Physical Exam: warm, good capillary refill, normal DP and PT pulses, normal sensory exam, and pain plantar left second metatarsalphalangeal joint, callus formation.  Radiographs:  none Assessment:  1. Capsulitis of metatarsophalangeal (MTP) joint of left foot   2. Hammer toe of left foot   3. Metatarsal deformity, left     Plan:  Patient was evaluated and treated and all questions answered.  Capsulitis -No repeat injection today. Callous courtesy Debridement. Hammertoe crest pad dispensed. Follow up should pain persist for possible injection. Ultimatwly may benefit from metarsal osteotomy.   Return if symptoms worsen or fail to improve.

## 2019-08-14 ENCOUNTER — Ambulatory Visit: Payer: BC Managed Care – PPO | Attending: Internal Medicine

## 2019-08-14 DIAGNOSIS — U071 COVID-19: Secondary | ICD-10-CM | POA: Insufficient documentation

## 2019-08-14 DIAGNOSIS — Z20822 Contact with and (suspected) exposure to covid-19: Secondary | ICD-10-CM

## 2019-08-15 LAB — NOVEL CORONAVIRUS, NAA: SARS-CoV-2, NAA: DETECTED — AB

## 2019-10-29 ENCOUNTER — Other Ambulatory Visit: Payer: Self-pay | Admitting: Surgery

## 2019-10-29 DIAGNOSIS — Z1231 Encounter for screening mammogram for malignant neoplasm of breast: Secondary | ICD-10-CM

## 2019-10-29 DIAGNOSIS — N6320 Unspecified lump in the left breast, unspecified quadrant: Secondary | ICD-10-CM

## 2019-11-14 ENCOUNTER — Other Ambulatory Visit: Payer: Self-pay

## 2019-11-14 ENCOUNTER — Ambulatory Visit
Admission: RE | Admit: 2019-11-14 | Discharge: 2019-11-14 | Disposition: A | Discharge status: Home / Self Care | Payer: BC Managed Care – PPO | Source: Ambulatory Visit | Attending: Surgery | Admitting: Surgery

## 2019-11-14 DIAGNOSIS — Z1231 Encounter for screening mammogram for malignant neoplasm of breast: Secondary | ICD-10-CM

## 2020-03-19 ENCOUNTER — Encounter: Payer: Self-pay | Admitting: Family Medicine

## 2020-03-19 ENCOUNTER — Ambulatory Visit: Payer: BC Managed Care – PPO | Admitting: Family Medicine

## 2020-03-19 ENCOUNTER — Ambulatory Visit (INDEPENDENT_AMBULATORY_CARE_PROVIDER_SITE_OTHER)
Admission: RE | Admit: 2020-03-19 | Discharge: 2020-03-19 | Disposition: A | Discharge status: Home / Self Care | Payer: BC Managed Care – PPO | Source: Ambulatory Visit | Attending: Family Medicine | Admitting: Family Medicine

## 2020-03-19 ENCOUNTER — Other Ambulatory Visit: Payer: Self-pay

## 2020-03-19 DIAGNOSIS — M545 Low back pain, unspecified: Secondary | ICD-10-CM | POA: Insufficient documentation

## 2020-03-19 DIAGNOSIS — G8929 Other chronic pain: Secondary | ICD-10-CM

## 2020-03-19 DIAGNOSIS — M5441 Lumbago with sciatica, right side: Secondary | ICD-10-CM | POA: Diagnosis not present

## 2020-03-19 MED ORDER — MELOXICAM 15 MG PO TABS: 15.0000 mg | ORAL_TABLET | Freq: Every day | ORAL | 0 refills | Status: DC

## 2020-03-19 NOTE — Progress Notes (Signed)
Subjective:    Patient ID: Katie Espinoza, female    DOB: September 17, 1965, 54 y.o.   MRN: [id number]  This visit occurred during the SARS-CoV-2 public health emergency.  Safety protocols were in place, including screening questions prior to the visit, additional usage of staff PPE, and extensive cleaning of exam room while observing appropriate contact time as indicated for disinfecting solutions.    HPI Pt presents with back pain   Wt Readings from Last 3 Encounters:  03/19/20 182 lb 1 oz (82.6 kg)  06/06/19 176 lb 5 oz (80 kg)  04/08/19 178 lb (80.7 kg)   30.30 kg/m  Back pain for 4 months  Low thoracic /upper lumbar- goes into the R side /muscles  Some funny feeling down her R leg to the foot  Tender to the touch No rash   Had issues several years ago  Does the stretches they gave her  Has been to PT   No specific injury   Very active- mowing and weed eating etc  Is a hygenist - spends day stooped over  Is R handed   Hurts to sit  Hurts worse to lie on L side  Both sharp and dull   No weakness  Heat helps in recliner    Last LS film DG Lumbar Spine Complete (Accession [id number]) (Order [id number]) Imaging Date: 08/21/2010 Department: [facility] [place] Released By: (auto-released) Authorizing: Preslie Depasquale, Wynelle Fanny, MD  Exam Status  Status  Final [99]  PACS Intelerad Image Link  Show images for DG Lumbar Spine Complete Study Result  Narrative  Clinical Data: Low back pain extending to the right leg for eight months.   FOUR VIEW LUMBAR SPINE:  No prior studies.   No visible fracture or subluxation. Mild facet overgrowth is present at L5-S1, left greater than right. Intervertebral disc height appears preserved.   IMPRESSION:   1. 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 Otherwise negative exam.   Provider: Florian Buff   She has arthritis in other areas   Otc:  Tries not to take anything  Cannot eat at work- so cannot take ibuprofen    Takes it occ at home   No exercise/fitness program  Used to go to ortho in [employer] [facility]   Patient Active Problem List   Diagnosis Date Noted  . Low back pain 03/19/2020  . Right foot pain 06/12/2019  . Shortness of breath 04/07/2019  . Chest discomfort 04/04/2019  . Rash and nonspecific skin eruption 06/21/2018  . Elevated blood sugar 03/29/2018  . Encounter for screening for HIV 03/23/2017  . Discharge from left nipple 05/01/2016  . S/P hysterectomy 05/04/2015  . Other screening mammogram 05/17/2012  . Anemia, iron deficiency 05/08/2011  . Family history of colon cancer 05/08/2011  . Encounter for routine gynecological examination 05/08/2011  . Routine general medical examination at a health care facility 05/01/2011  . 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 Chest pain of uncertain etiology 49/17/9150  . MIGRAINE HEADACHE 02/14/2007  . RHINITIS, ALLERGIC NEC 02/14/2007   Past Medical History:  Diagnosis Date  . Chronic constipation   . Heavy menstrual bleeding   . Iron deficiency anemia    IV Iron infusions--  last infusion 09-23-22016  . Migraine   . PONV (postoperative nausea and vomiting)   . Uterine fibroid   . Wears glasses    Past Surgical History:  Procedure Laterality Date  . ABDOMINAL HYSTERECTOMY    . BILATERAL SALPINGECTOMY Bilateral 05/04/2015  Procedure: BILATERAL SALPINGECTOMY;  Surgeon: Linda Hedges, DO;  Location: [place];  Service: Gynecology;  Laterality: Bilateral;  . BREAST BIOPSY Left 2017  . BREAST BIOPSY Right   . BREAST LUMPECTOMY Left 06/13/2018   Procedure: LEFT RETROAREOLAR  LUMPECTOMY;  Surgeon: Donnie Mesa, MD;  Location: [place];  Service: General;  Laterality: Left;  . COLONOSCOPY  2007  . CYSTOSCOPY N/A 05/04/2015   Procedure: CYSTOSCOPY;  Surgeon: Linda Hedges, DO;  Location: [facility];  Service: Gynecology;  Laterality: N/A;  . DILATATION & CURETTAGE/HYSTEROSCOPY WITH  TRUECLEAR N/A 10/22/2013   Procedure: DILATATION & CURETTAGE/HYSTEROSCOPY WITH TRUCLEAR;  Surgeon: Linda Hedges, DO;  Location: [facility];  Service: Gynecology;  Laterality: N/A;  . LAPAROSCOPIC ASSISTED VAGINAL HYSTERECTOMY N/A 05/04/2015   Procedure: LAPAROSCOPIC ASSISTED VAGINAL HYSTERECTOMY;  Surgeon: Linda Hedges, DO;  Location: [place];  Service: Gynecology;  Laterality: N/A;  need bed   Social History   Tobacco Use  . Smoking status: Former Smoker    Years: 2.00    Types: Cigarettes    Quit date: 07/31/1990    Years since quitting: 29.6  . Smokeless tobacco: Never Used  Vaping Use  . Vaping Use: Never used  Substance Use Topics  . Alcohol use: Yes    Alcohol/week: 0.0 standard drinks    Comment: rare  . Drug use: No   Family History  Problem Relation Age of Onset  . Cancer Father        colon  . Colon cancer Father 35  . Hyperlipidemia Mother   . Colon cancer Paternal Uncle 51  . Heart Problems Maternal Grandfather   . Esophageal cancer Neg Hx   . Stomach cancer Neg Hx   . Rectal cancer Neg Hx    Allergies  Allergen Reactions  . Erythromycin Rash  . Penicillins Rash    06/13/18 received 2 Gms Ancef with no apparent reaction   Current Outpatient Medications on File Prior to Visit  Medication Sig Dispense Refill  . cetirizine (ZYRTEC) 10 MG tablet Take 10 mg by mouth daily.    Marland Kitchen docusate sodium (COLACE) 100 MG capsule Take 1 capsule (100 mg total) by mouth 2 (two) times daily. (Patient taking differently: Take 100 mg by mouth daily. ) 60 capsule 1  . ibuprofen (ADVIL,MOTRIN) 800 MG tablet Take 1 tablet (800 mg total) by mouth every 8 (eight) hours as needed. 30 tablet 0   No current facility-administered medications on file prior to visit.    Review of Systems  Constitutional: Negative for activity change, appetite change, fatigue, fever and unexpected weight change.  HENT: Negative for congestion, ear pain, rhinorrhea, sinus pressure and sore  throat.   Eyes: Negative for pain, redness and visual disturbance.  Respiratory: Negative for cough, shortness of breath and wheezing.   Cardiovascular: Negative for chest pain and palpitations.  Gastrointestinal: Negative for abdominal pain, blood in stool, constipation and diarrhea.  Endocrine: Negative for polydipsia and polyuria.  Genitourinary: Negative for dysuria, frequency and urgency.  Musculoskeletal: Positive for back pain. Negative for arthralgias and myalgias.  Skin: Negative for pallor and rash.  Allergic/Immunologic: Negative for environmental allergies.  Neurological: Negative for dizziness, syncope and headaches.  Hematological: Negative for adenopathy. Does not bruise/bleed easily.  Psychiatric/Behavioral: Negative for decreased concentration and dysphoric mood. The patient is not nervous/anxious.        Objective:   Physical Exam Constitutional:      General: She is not in acute  distress.    Appearance: Normal appearance. She is well-developed. She is obese.  HENT:     Head: Normocephalic and atraumatic.  Eyes:     General: No scleral icterus.    Conjunctiva/sclera: Conjunctivae normal.     Pupils: Pupils are equal, round, and reactive to light.  Cardiovascular:     Rate and Rhythm: Normal rate and regular rhythm.  Pulmonary:     Effort: Pulmonary effort is normal.     Breath sounds: Normal breath sounds. No wheezing or rales.  Abdominal:     General: Bowel sounds are normal. There is no distension.     Palpations: Abdomen is soft.     Tenderness: There is no abdominal tenderness.  Musculoskeletal:        General: Tenderness present.     Cervical back: Normal range of motion and neck supple.     Lumbar back: Spasms and tenderness present. No swelling, edema or bony tenderness. Decreased range of motion. Negative right straight leg raise test and negative left straight leg raise test. No scoliosis.     Comments: Some back (not leg) pain with R bent knee  raise Nl gait  Tender/tight muscles bilateral lumbar/worse on the R Flex-full Ext- 20 deg with pain  R lat flex- painful   Lymphadenopathy:     Cervical: No cervical adenopathy.  Skin:    General: Skin is warm and dry.     Coloration: Skin is not pale.     Findings: No erythema or rash.  Neurological:     Mental Status: She is alert.     Cranial Nerves: No cranial nerve deficit.     Sensory: No sensory deficit.     Motor: No atrophy or abnormal muscle tone.     Coordination: Coordination normal.     Deep Tendon Reflexes: Reflexes are normal and symmetric. Reflexes normal.     Comments: Negative SLR  Psychiatric:        Mood and Affect: Mood normal.           Assessment & Plan:   Problem List Items Addressed This Visit      Other   Low back pain    With mild R sided sciatic symptoms  Px meloxicam to take with food as needed  Xray ordered (some hx of facet change at L5-A1 in the past  She stoops over for work Inst: Use heat on your back to relax muscles  Ice may help also to reduce inflammation  Use your TENS unit   Adjust ergonomics at work whenever possible  Walk for exercise  Try meloxicam daily for food for at least 2 weeks  Xray now  Do your stretches regularly       Relevant Medications   meloxicam (MOBIC) 15 MG tablet   Other Relevant Orders   DG Lumbar Spine Complete (Completed)

## 2020-03-19 NOTE — Patient Instructions (Addendum)
Use heat on your back to relax muscles  Ice may help also to reduce inflammation  Use your TENS unit    Adjust ergonomics at work whenever possible   Walk for exercise   Try meloxicam daily for food for at least 2 weeks   Xray now   Do your stretches regularly

## 2020-03-21 NOTE — Assessment & Plan Note (Signed)
With mild R sided sciatic symptoms  Px meloxicam to take with food as needed  Xray ordered (some hx of facet change at L5-A1 in the past  She stoops over for work Inst: Use heat on your back to relax muscles  Ice may help also to reduce inflammation  Use your TENS unit   Adjust ergonomics at work whenever possible  Walk for exercise  Try meloxicam daily for food for at least 2 weeks  Xray now  Do your stretches regularly

## 2020-04-01 ENCOUNTER — Telehealth: Payer: Self-pay | Admitting: Family Medicine

## 2020-04-01 DIAGNOSIS — Z0000 Encounter for general adult medical examination without abnormal findings: Secondary | ICD-10-CM

## 2020-04-01 DIAGNOSIS — Z1159 Encounter for screening for other viral diseases: Secondary | ICD-10-CM | POA: Insufficient documentation

## 2020-04-01 DIAGNOSIS — R739 Hyperglycemia, unspecified: Secondary | ICD-10-CM

## 2020-04-01 NOTE — Telephone Encounter (Signed)
-----   Message from Ellamae Sia sent at 03/19/2020 10:06 AM EDT ----- Regarding: Lab orders for Friday, 9.3.21 Patient is scheduled for CPX labs, please order future labs, Thanks , Karna Christmas

## 2020-04-02 ENCOUNTER — Other Ambulatory Visit: Payer: Self-pay

## 2020-04-02 ENCOUNTER — Other Ambulatory Visit (INDEPENDENT_AMBULATORY_CARE_PROVIDER_SITE_OTHER): Payer: BC Managed Care – PPO

## 2020-04-02 DIAGNOSIS — Z1159 Encounter for screening for other viral diseases: Secondary | ICD-10-CM | POA: Diagnosis not present

## 2020-04-02 DIAGNOSIS — R739 Hyperglycemia, unspecified: Secondary | ICD-10-CM | POA: Diagnosis not present

## 2020-04-02 DIAGNOSIS — Z0000 Encounter for general adult medical examination without abnormal findings: Secondary | ICD-10-CM

## 2020-04-02 LAB — LIPID PANEL
Cholesterol: 170 mg/dL (ref 0–200)
HDL: 53.4 mg/dL (ref >39.00)
LDL Cholesterol: 103 mg/dL — ABNORMAL HIGH (ref 0–99)
NonHDL: 116.63
Total CHOL/HDL Ratio: 3
Triglycerides: 69 mg/dL (ref 0.0–149.0)
VLDL: 13.8 mg/dL (ref 0.0–40.0)

## 2020-04-02 LAB — TSH: TSH: 2.18 u[IU]/mL (ref 0.35–4.50)

## 2020-04-02 LAB — CBC WITH DIFFERENTIAL/PLATELET
Basophils Absolute: 0 10*3/uL (ref 0.0–0.1)
Basophils Relative: 1 % (ref 0.0–3.0)
Eosinophils Absolute: 0.1 10*3/uL (ref 0.0–0.7)
Eosinophils Relative: 2.3 % (ref 0.0–5.0)
HCT: 38.9 % (ref 36.0–46.0)
Hemoglobin: 12.8 g/dL (ref 12.0–15.0)
Lymphocytes Relative: 26.6 % (ref 12.0–46.0)
Lymphs Abs: 1.3 10*3/uL (ref 0.7–4.0)
MCHC: 33 g/dL (ref 30.0–36.0)
MCV: 87 fl (ref 78.0–100.0)
Monocytes Absolute: 0.4 10*3/uL (ref 0.1–1.0)
Monocytes Relative: 7.5 % (ref 3.0–12.0)
Neutro Abs: 3.2 10*3/uL (ref 1.4–7.7)
Neutrophils Relative %: 62.6 % (ref 43.0–77.0)
Platelets: 235 10*3/uL (ref 150.0–400.0)
RBC: 4.47 Mil/uL (ref 3.87–5.11)
RDW: 13.2 % (ref 11.5–15.5)
WBC: 5.1 10*3/uL (ref 4.0–10.5)

## 2020-04-02 LAB — COMPREHENSIVE METABOLIC PANEL
ALT: 23 U/L (ref 0–35)
AST: 21 U/L (ref 0–37)
Albumin: 4.4 g/dL (ref 3.5–5.2)
Alkaline Phosphatase: 61 U/L (ref 39–117)
BUN: 16 mg/dL (ref 6–23)
CO2: 27 mEq/L (ref 19–32)
Calcium: 9.3 mg/dL (ref 8.4–10.5)
Chloride: 104 mEq/L (ref 96–112)
Creatinine, Ser: 0.91 mg/dL (ref 0.40–1.20)
GFR: 64.64 mL/min (ref >60.00)
Glucose, Bld: 96 mg/dL (ref 70–99)
Potassium: 4.4 mEq/L (ref 3.5–5.1)
Sodium: 138 mEq/L (ref 135–145)
Total Bilirubin: 0.3 mg/dL (ref 0.2–1.2)
Total Protein: 6.9 g/dL (ref 6.0–8.3)

## 2020-04-02 LAB — HEMOGLOBIN A1C: Hgb A1c MFr Bld: 5.9 % (ref 4.6–6.5)

## 2020-04-06 LAB — HEPATITIS C ANTIBODY
Hepatitis C Ab: NONREACTIVE
SIGNAL TO CUT-OFF: 0.02 (ref <1.00)

## 2020-04-09 ENCOUNTER — Encounter: Payer: Self-pay | Admitting: Family Medicine

## 2020-04-09 ENCOUNTER — Other Ambulatory Visit: Payer: Self-pay

## 2020-04-09 ENCOUNTER — Ambulatory Visit (INDEPENDENT_AMBULATORY_CARE_PROVIDER_SITE_OTHER): Payer: BC Managed Care – PPO | Admitting: Family Medicine

## 2020-04-09 VITALS — BP 118/78 | HR 81 | Temp 97.4°F | Ht 64.5 in | Wt 179.1 lb

## 2020-04-09 DIAGNOSIS — R739 Hyperglycemia, unspecified: Secondary | ICD-10-CM | POA: Diagnosis not present

## 2020-04-09 DIAGNOSIS — Z0000 Encounter for general adult medical examination without abnormal findings: Secondary | ICD-10-CM

## 2020-04-09 DIAGNOSIS — Z23 Encounter for immunization: Secondary | ICD-10-CM | POA: Diagnosis not present

## 2020-04-09 NOTE — Patient Instructions (Signed)
Tetanus shot today   Take care of yourself  Eat healthy and stay active   Try to get 1200-1500 mg of calcium per day with at least 1000 iu of vitamin D - for bone health

## 2020-04-09 NOTE — Progress Notes (Signed)
Subjective:    Patient ID: Katie Espinoza, female    DOB: [birth year], 54 y.o.   MRN: [id number]  This visit occurred during the SARS-CoV-2 public health emergency.  Safety protocols were in place, including screening questions prior to the visit, additional usage of staff PPE, and extensive cleaning of exam room while observing appropriate contact time as indicated for disinfecting solutions.    HPI  Here for health maintenance exam and to review chronic medical problems    Wt Readings from Last 3 Encounters:  04/09/20 179 lb 1 oz (81.2 kg)  03/19/20 182 lb 1 oz (82.6 kg)  06/06/19 176 lb 5 oz (80 kg)  wt is down a bit  Cut out some carbs and moving more  30.26 kg/m   Walking for exercise    Feeling good  Td 6/11-will get today   Flu shot-declines for now   covid vaccinated- had first vaccine 9/3   Mammogram 4/21 Self breast exam - no lumps   Colonoscopy 4/19 with 5 y recall  fam hx of colon cancer   Hep C screen neg   BP Readings from Last 3 Encounters:  04/09/20 118/78  03/19/20 124/68  06/06/19 108/66   Pulse Readings from Last 3 Encounters:  04/09/20 81  03/19/20 (!) 59  06/06/19 62    Pt has h/o elevated glucose Lab Results  Component Value Date   HGBA1C 5.9 04/02/2020     Cholesterol  Lab Results  Component Value Date   CHOL 170 04/02/2020   CHOL 170 04/25/2019   CHOL 176 03/22/2018   Lab Results  Component Value Date   HDL 53.40 04/02/2020   HDL 56.60 04/25/2019   HDL 60.70 03/22/2018   Lab Results  Component Value Date   LDLCALC 103 (H) 04/02/2020   LDLCALC 102 (H) 04/25/2019   LDLCALC 101 (H) 03/22/2018   Lab Results  Component Value Date   TRIG 69.0 04/02/2020   TRIG 60.0 04/25/2019   TRIG 72.0 03/22/2018   Lab Results  Component Value Date   CHOLHDL 3 04/02/2020   CHOLHDL 3 04/25/2019   CHOLHDL 3 03/22/2018   Lab Results  Component Value Date   LDLDIRECT 107.7 07/14/2013   Patient Active Problem List   Diagnosis  Date Noted  . Encounter for hepatitis C screening test for low risk patient 04/01/2020  . Low back pain 03/19/2020  . Elevated blood sugar 03/29/2018  . Encounter for screening for HIV 03/23/2017  . S/P hysterectomy 05/04/2015  . Other screening mammogram 05/17/2012  . Anemia, iron deficiency 05/08/2011  . Family history of colon cancer 05/08/2011  . Encounter for routine gynecological examination 05/08/2011  . Routine general medical examination at a health care facility 05/01/2011  . 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 MIGRAINE HEADACHE 02/14/2007  . RHINITIS, ALLERGIC NEC 02/14/2007   Past Medical History:  Diagnosis Date  . Chronic constipation   . Heavy menstrual bleeding   . Iron deficiency anemia    IV Iron infusions--  last infusion 09-23-22016  . Migraine   . PONV (postoperative nausea and vomiting)   . Uterine fibroid   . Wears glasses    Past Surgical History:  Procedure Laterality Date  . ABDOMINAL HYSTERECTOMY    . BILATERAL SALPINGECTOMY Bilateral 05/04/2015   Procedure: BILATERAL SALPINGECTOMY;  Surgeon: Linda Hedges, DO;  Location: [place];  Service: Gynecology;  Laterality: Bilateral;  . BREAST BIOPSY Left 2017  . BREAST BIOPSY Right   . BREAST  LUMPECTOMY Left 06/13/2018   Procedure: LEFT RETROAREOLAR  LUMPECTOMY;  Surgeon: Donnie Mesa, MD;  Location: [place];  Service: General;  Laterality: Left;  . COLONOSCOPY  2007  . CYSTOSCOPY N/A 05/04/2015   Procedure: CYSTOSCOPY;  Surgeon: Linda Hedges, DO;  Location: [facility];  Service: Gynecology;  Laterality: N/A;  . DILATATION & CURETTAGE/HYSTEROSCOPY WITH TRUECLEAR N/A 10/22/2013   Procedure: DILATATION & CURETTAGE/HYSTEROSCOPY WITH TRUCLEAR;  Surgeon: Linda Hedges, DO;  Location: [facility];  Service: Gynecology;  Laterality: N/A;  . LAPAROSCOPIC ASSISTED VAGINAL HYSTERECTOMY N/A 05/04/2015   Procedure: LAPAROSCOPIC ASSISTED VAGINAL HYSTERECTOMY;  Surgeon: Linda Hedges, DO;  Location: [place];  Service: Gynecology;  Laterality: N/A;  need bed   Social History   Tobacco Use  . Smoking status: Former Smoker    Years: 2.00    Types: Cigarettes    Quit date: 07/31/1990    Years since quitting: 29.7  . Smokeless tobacco: Never Used  Vaping Use  . Vaping Use: Never used  Substance Use Topics  . Alcohol use: Yes    Alcohol/week: 0.0 standard drinks    Comment: rare  . Drug use: No   Family History  Problem Relation Age of Onset  . Cancer Father        colon  . Colon cancer Father 32  . Hyperlipidemia Mother   . Colon cancer Paternal Uncle 88  . Heart Problems Maternal Grandfather   . Esophageal cancer Neg Hx   . Stomach cancer Neg Hx   . Rectal cancer Neg Hx    Allergies  Allergen Reactions  . Erythromycin Rash  . Penicillins Rash    06/13/18 received 2 Gms Ancef with no apparent reaction   Current Outpatient Medications on File Prior to Visit  Medication Sig Dispense Refill  . cetirizine (ZYRTEC) 10 MG tablet Take 10 mg by mouth daily.    Marland Kitchen docusate sodium (COLACE) 100 MG capsule Take 1 capsule (100 mg total) by mouth 2 (two) times daily. (Patient taking differently: Take 100 mg by mouth daily. ) 60 capsule 1  . ibuprofen (ADVIL,MOTRIN) 800 MG tablet Take 1 tablet (800 mg total) by mouth every 8 (eight) hours as needed. 30 tablet 0  . meloxicam (MOBIC) 15 MG tablet Take 1 tablet (15 mg total) by mouth daily. With food 30 tablet 0   No current facility-administered medications on file prior to visit.      Review of Systems  Constitutional: Negative for activity change, appetite change, fatigue, fever and unexpected weight change.  HENT: Negative for congestion, ear pain, rhinorrhea, sinus pressure and sore throat.   Eyes: Negative for pain, redness and visual disturbance.  Respiratory: Negative for cough, shortness of breath and wheezing.   Cardiovascular: Negative for chest pain and palpitations.    Gastrointestinal: Negative for abdominal pain, blood in stool, constipation and diarrhea.  Endocrine: Negative for polydipsia and polyuria.  Genitourinary: Negative for dysuria, frequency and urgency.  Musculoskeletal: Negative for arthralgias, back pain and myalgias.  Skin: Negative for pallor and rash.  Allergic/Immunologic: Negative for environmental allergies.  Neurological: Negative for dizziness, syncope and headaches.  Hematological: Negative for adenopathy. Does not bruise/bleed easily.  Psychiatric/Behavioral: Negative for decreased concentration and dysphoric mood. The patient is not nervous/anxious.        Objective:   Physical Exam Constitutional:      General: She is not in acute distress.    Appearance: Normal appearance. She is well-developed. She is obese.  She is not ill-appearing or diaphoretic.  HENT:     Head: Normocephalic and atraumatic.     Right Ear: Tympanic membrane, ear canal and external ear normal.     Left Ear: Tympanic membrane, ear canal and external ear normal.     Nose: Nose normal. No congestion.     Mouth/Throat:     Mouth: Mucous membranes are moist.     Pharynx: Oropharynx is clear. No posterior oropharyngeal erythema.  Eyes:     General: No scleral icterus.    Extraocular Movements: Extraocular movements intact.     Conjunctiva/sclera: Conjunctivae normal.     Pupils: Pupils are equal, round, and reactive to light.  Neck:     Thyroid: No thyromegaly.     Vascular: No carotid bruit or JVD.  Cardiovascular:     Rate and Rhythm: Normal rate and regular rhythm.     Pulses: Normal pulses.     Heart sounds: Normal heart sounds. No gallop.   Pulmonary:     Effort: Pulmonary effort is normal. No respiratory distress.     Breath sounds: Normal breath sounds. No wheezing.     Comments: Good air exch Chest:     Chest wall: No tenderness.  Abdominal:     General: Bowel sounds are normal. There is no distension or abdominal bruit.      Palpations: Abdomen is soft. There is no mass.     Tenderness: There is no abdominal tenderness.     Hernia: No hernia is present.  Genitourinary:    Comments: Breast exam: No mass, nodules, thickening, tenderness, bulging, retraction, inflamation, nipple discharge or skin changes noted.  No axillary or clavicular LA.     Musculoskeletal:        General: No tenderness. Normal range of motion.     Cervical back: Normal range of motion and neck supple. No rigidity. No muscular tenderness.     Right lower leg: No edema.     Left lower leg: No edema.  Lymphadenopathy:     Cervical: No cervical adenopathy.  Skin:    General: Skin is warm and dry.     Coloration: Skin is not pale.     Findings: No erythema or rash.     Comments: Solar lentigines diffusely   Neurological:     Mental Status: She is alert. Mental status is at baseline.     Cranial Nerves: No cranial nerve deficit.     Motor: No abnormal muscle tone.     Coordination: Coordination normal.     Gait: Gait normal.     Deep Tendon Reflexes: Reflexes are normal and symmetric. Reflexes normal.  Psychiatric:        Mood and Affect: Mood normal.        Cognition and Memory: Cognition and memory normal.           Assessment & Plan:   Problem List Items Addressed This Visit      Other   Routine general medical examination at a health care facility - Primary    Reviewed health habits including diet and exercise and skin cancer prevention Reviewed appropriate screening tests for age  Also reviewed health mt list, fam hx and immunization status , as well as social and family history   See HPI Labs reviewed  Td vaccine today  Declines flu vaccine for now  Had first covid vaccine on 9/3-plans to complete series Hep C screen neg Counseled on ca and D for bone health  Elevated blood sugar    Lab Results  Component Value Date   HGBA1C 5.9 04/02/2020   disc imp of low glycemic diet and wt loss to prevent DM2         Other Visit Diagnoses    Need for Td vaccine       Relevant Orders   Td vaccine greater than or equal to 7yo preservative free IM (Completed)

## 2020-04-11 ENCOUNTER — Other Ambulatory Visit: Payer: Self-pay | Admitting: Family Medicine

## 2020-04-11 NOTE — Assessment & Plan Note (Signed)
Reviewed health habits including diet and exercise and skin cancer prevention Reviewed appropriate screening tests for age  Also reviewed health mt list, fam hx and immunization status , as well as social and family history   See HPI Labs reviewed  Td vaccine today  Declines flu vaccine for now  Had first covid vaccine on 9/3-plans to complete series Hep C screen neg Counseled on ca and D for bone health

## 2020-04-11 NOTE — Assessment & Plan Note (Signed)
Lab Results  Component Value Date   HGBA1C 5.9 04/02/2020   disc imp of low glycemic diet and wt loss to prevent DM2

## 2021-03-28 ENCOUNTER — Telehealth: Payer: Self-pay | Admitting: Family Medicine

## 2021-03-28 ENCOUNTER — Telehealth: Payer: Self-pay

## 2021-03-28 DIAGNOSIS — Z114 Encounter for screening for human immunodeficiency virus [HIV]: Secondary | ICD-10-CM

## 2021-03-28 DIAGNOSIS — Z1159 Encounter for screening for other viral diseases: Secondary | ICD-10-CM

## 2021-03-28 DIAGNOSIS — R739 Hyperglycemia, unspecified: Secondary | ICD-10-CM

## 2021-03-28 DIAGNOSIS — Z0000 Encounter for general adult medical examination without abnormal findings: Secondary | ICD-10-CM

## 2021-03-28 NOTE — Telephone Encounter (Signed)
Received call from husband patient works as Copywriter, advertising and would like to add HIV screening to labs she has scheduled soon.

## 2021-03-28 NOTE — Telephone Encounter (Signed)
I put the orders in  Also added Hep C for the same reason (work exp)

## 2021-03-28 NOTE — Telephone Encounter (Signed)
-----   Message from Cloyd Stagers, RT sent at 03/14/2021  4:07 PM EDT ----- Regarding: Lab Orders for Friday 9.2.2022 Please place lab orders for Friday 9.2.2022, office visit for physical on Friday 9.9.2022 Thank you, Dyke Maes RT(R)

## 2021-04-01 ENCOUNTER — Other Ambulatory Visit: Payer: Self-pay

## 2021-04-01 ENCOUNTER — Other Ambulatory Visit (INDEPENDENT_AMBULATORY_CARE_PROVIDER_SITE_OTHER): Payer: BC Managed Care – PPO

## 2021-04-01 DIAGNOSIS — Z0000 Encounter for general adult medical examination without abnormal findings: Secondary | ICD-10-CM

## 2021-04-01 DIAGNOSIS — Z1159 Encounter for screening for other viral diseases: Secondary | ICD-10-CM | POA: Diagnosis not present

## 2021-04-01 DIAGNOSIS — R739 Hyperglycemia, unspecified: Secondary | ICD-10-CM | POA: Diagnosis not present

## 2021-04-01 DIAGNOSIS — Z114 Encounter for screening for human immunodeficiency virus [HIV]: Secondary | ICD-10-CM

## 2021-04-01 LAB — CBC WITH DIFFERENTIAL/PLATELET
Basophils Absolute: 0 10*3/uL (ref 0.0–0.1)
Basophils Relative: 0.7 % (ref 0.0–3.0)
Eosinophils Absolute: 0.1 10*3/uL (ref 0.0–0.7)
Eosinophils Relative: 2.7 % (ref 0.0–5.0)
HCT: 38.6 % (ref 36.0–46.0)
Hemoglobin: 12.6 g/dL (ref 12.0–15.0)
Lymphocytes Relative: 28.6 % (ref 12.0–46.0)
Lymphs Abs: 1.3 10*3/uL (ref 0.7–4.0)
MCHC: 32.6 g/dL (ref 30.0–36.0)
MCV: 86.5 fl (ref 78.0–100.0)
Monocytes Absolute: 0.3 10*3/uL (ref 0.1–1.0)
Monocytes Relative: 6.8 % (ref 3.0–12.0)
Neutro Abs: 2.7 10*3/uL (ref 1.4–7.7)
Neutrophils Relative %: 61.2 % (ref 43.0–77.0)
Platelets: 223 10*3/uL (ref 150.0–400.0)
RBC: 4.46 Mil/uL (ref 3.87–5.11)
RDW: 13 % (ref 11.5–15.5)
WBC: 4.5 10*3/uL (ref 4.0–10.5)

## 2021-04-01 LAB — LIPID PANEL
Cholesterol: 194 mg/dL (ref 0–200)
HDL: 51.7 mg/dL (ref >39.00)
LDL Cholesterol: 125 mg/dL — ABNORMAL HIGH (ref 0–99)
NonHDL: 141.8
Total CHOL/HDL Ratio: 4
Triglycerides: 86 mg/dL (ref 0.0–149.0)
VLDL: 17.2 mg/dL (ref 0.0–40.0)

## 2021-04-01 LAB — COMPREHENSIVE METABOLIC PANEL
ALT: 25 U/L (ref 0–35)
AST: 19 U/L (ref 0–37)
Albumin: 4.3 g/dL (ref 3.5–5.2)
Alkaline Phosphatase: 56 U/L (ref 39–117)
BUN: 19 mg/dL (ref 6–23)
CO2: 29 mEq/L (ref 19–32)
Calcium: 9.5 mg/dL (ref 8.4–10.5)
Chloride: 105 mEq/L (ref 96–112)
Creatinine, Ser: 0.94 mg/dL (ref 0.40–1.20)
GFR: 68.98 mL/min (ref >60.00)
Glucose, Bld: 90 mg/dL (ref 70–99)
Potassium: 4.7 mEq/L (ref 3.5–5.1)
Sodium: 141 mEq/L (ref 135–145)
Total Bilirubin: 0.5 mg/dL (ref 0.2–1.2)
Total Protein: 6.6 g/dL (ref 6.0–8.3)

## 2021-04-01 LAB — HEMOGLOBIN A1C: Hgb A1c MFr Bld: 5.9 % (ref 4.6–6.5)

## 2021-04-01 LAB — TSH: TSH: 2.28 u[IU]/mL (ref 0.35–5.50)

## 2021-04-05 LAB — HIV ANTIBODY (ROUTINE TESTING W REFLEX): HIV 1&2 Ab, 4th Generation: NONREACTIVE

## 2021-04-05 LAB — HEPATITIS C ANTIBODY
Hepatitis C Ab: NONREACTIVE
SIGNAL TO CUT-OFF: 0.01 (ref <1.00)

## 2021-04-08 ENCOUNTER — Encounter: Payer: BC Managed Care – PPO | Admitting: Family Medicine

## 2021-06-03 ENCOUNTER — Encounter: Payer: Self-pay | Admitting: Family Medicine

## 2021-06-03 ENCOUNTER — Other Ambulatory Visit: Payer: Self-pay

## 2021-06-03 ENCOUNTER — Ambulatory Visit (INDEPENDENT_AMBULATORY_CARE_PROVIDER_SITE_OTHER): Payer: BC Managed Care – PPO | Admitting: Family Medicine

## 2021-06-03 VITALS — BP 114/68 | HR 65 | Temp 97.9°F | Ht 65.0 in | Wt 185.0 lb

## 2021-06-03 DIAGNOSIS — R739 Hyperglycemia, unspecified: Secondary | ICD-10-CM

## 2021-06-03 DIAGNOSIS — Z0000 Encounter for general adult medical examination without abnormal findings: Secondary | ICD-10-CM

## 2021-06-03 DIAGNOSIS — Z800 Family history of malignant neoplasm of digestive organs: Secondary | ICD-10-CM

## 2021-06-03 NOTE — Progress Notes (Signed)
Subjective:    Patient ID: Katie Espinoza, female    DOB: Mar 29, 1966, 55 y.o.   MRN: [id number]  This visit occurred during the SARS-CoV-2 public health emergency.  Safety protocols were in place, including screening questions prior to the visit, additional usage of staff PPE, and extensive cleaning of exam room while observing appropriate contact time as indicated for disinfecting solutions.   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  Not getting to walk due to busy at work = long days  Plans to use recumbent bike   Zoster status  -not interested in vaccine  Covid immunized  Flu shot - declines  Td 9/21  Mammogram 4/21 - needs to schedule at the breast center Self breast exam-no lumps   Pap 7/16 Hysterectomy No problems    Colonoscopy 4/19 with 5 y recall   HIV screen neg Hep C screen neg   BP Readings from Last 3 Encounters:  06/03/21 114/68  04/09/20 118/78  03/19/20 124/68   Pulse Readings from Last 3 Encounters:  06/03/21 65  04/09/20 81  03/19/20 (!) 59   Some hot flashes    Cholesterol Lab Results  Component Value Date   CHOL 194 04/01/2021   CHOL 170 04/02/2020   CHOL 170 04/25/2019   Lab Results  Component Value Date   HDL 51.70 04/01/2021   HDL 53.40 04/02/2020   HDL 56.60 04/25/2019   Lab Results  Component Value Date   LDLCALC 125 (H) 04/01/2021   LDLCALC 103 (H) 04/02/2020   LDLCALC 102 (H) 04/25/2019   Lab Results  Component Value Date   TRIG 86.0 04/01/2021   TRIG 69.0 04/02/2020   TRIG 60.0 04/25/2019   Lab Results  Component Value Date   CHOLHDL 4 04/01/2021   CHOLHDL 3 04/02/2020   CHOLHDL 3 04/25/2019   Lab Results  Component Value Date   LDLDIRECT 107.7 07/14/2013   Eating junk  Working longer hours  Convenience eating  Has family h/o of high cholesterol   Other labs Results for orders placed or performed in visit on 04/01/21  Hepatitis C antibody  Result Value Ref Range   Hepatitis C Ab NON-REACTIVE NON-REACTIVE   SIGNAL TO CUT-OFF 0.01 <1.00  HIV Antibody (routine testing w rflx)  Result Value Ref Range   HIV 1&2 Ab, 4th Generation NON-REACTIVE NON-REACTIVE  TSH  Result Value Ref Range   TSH 2.28 0.35 - 5.50 uIU/mL  Lipid panel  Result Value Ref Range   Cholesterol 194 0 - 200 mg/dL   Triglycerides 86.0 0.0 - 149.0 mg/dL   HDL 51.70 >39.00 mg/dL   VLDL 17.2 0.0 - 40.0 mg/dL   LDL Cholesterol 125 (H) 0 - 99 mg/dL   Total CHOL/HDL Ratio 4    NonHDL 141.80   Hemoglobin A1c  Result Value Ref Range   Hgb A1c MFr Bld 5.9 4.6 - 6.5 %  Comprehensive metabolic panel  Result Value Ref Range   Sodium 141 135 - 145 mEq/L   Potassium 4.7 3.5 - 5.1 mEq/L   Chloride 105 96 - 112 mEq/L   CO2 29 19 - 32 mEq/L   Glucose, Bld 90 70 -  99 mg/dL   BUN 19 6 - 23 mg/dL   Creatinine, Ser 0.94 0.40 - 1.20 mg/dL   Total Bilirubin 0.5 0.2 - 1.2 mg/dL   Alkaline Phosphatase 56 39 - 117 U/L   AST 19 0 - 37 U/L   ALT 25 0 - 35 U/L   Total Protein 6.6 6.0 - 8.3 g/dL   Albumin 4.3 3.5 - 5.2 g/dL   GFR 68.98 >60.00 mL/min   Calcium 9.5 8.4 - 10.5 mg/dL  CBC with Differential/Platelet  Result Value Ref Range   WBC 4.5 4.0 - 10.5 K/uL   RBC 4.46 3.87 - 5.11 Mil/uL   Hemoglobin 12.6 12.0 - 15.0 g/dL   HCT 38.6 36.0 - 46.0 %   MCV 86.5 78.0 - 100.0 fl   MCHC 32.6 30.0 - 36.0 g/dL   RDW 13.0 11.5 - 15.5 %   Platelets 223.0 150.0 - 400.0 K/uL   Neutrophils Relative % 61.2 43.0 - 77.0 %   Lymphocytes Relative 28.6 12.0 - 46.0 %   Monocytes Relative 6.8 3.0 - 12.0 %   Eosinophils Relative 2.7 0.0 - 5.0 %   Basophils Relative 0.7 0.0 - 3.0 %   Neutro Abs 2.7 1.4 - 7.7 K/uL   Lymphs Abs 1.3 0.7 - 4.0 K/uL   Monocytes Absolute 0.3 0.1 - 1.0 K/uL    Eosinophils Absolute 0.1 0.0 - 0.7 K/uL   Basophils Absolute 0.0 0.0 - 0.1 K/uL      H/o elevated glucose Lab Results  Component Value Date   HGBA1C 5.9 04/01/2021   Stable  Fairly good diet, mindful of sugar  Also active  Patient Active Problem List   Diagnosis Date Noted   Encounter for hepatitis C screening test for low risk patient 04/01/2020   Low back pain 03/19/2020   Elevated blood sugar 03/29/2018   Encounter for screening for HIV 03/23/2017   S/P hysterectomy 05/04/2015   Other screening mammogram 05/17/2012   Anemia, iron deficiency 05/08/2011   Family history of colon cancer 05/08/2011   Encounter for routine gynecological examination 05/08/2011   Routine general medical examination at a health care facility 05/01/2011   ACTINIC KERATOSIS 07/26/2010   MIGRAINE HEADACHE 02/14/2007   RHINITIS, ALLERGIC NEC 02/14/2007   Past Medical History:  Diagnosis Date   Chronic constipation    Heavy menstrual bleeding    Iron deficiency anemia    IV Iron infusions--  last infusion 09-23-22016   Migraine    PONV (postoperative nausea and vomiting)    Uterine fibroid    Wears glasses    Past Surgical History:  Procedure Laterality Date   ABDOMINAL HYSTERECTOMY     BILATERAL SALPINGECTOMY Bilateral 05/04/2015   Procedure: BILATERAL SALPINGECTOMY;  Surgeon: Linda Hedges, DO;  Location: [facility];  Service: Gynecology;  Laterality: Bilateral;   BREAST BIOPSY Left 2017   BREAST BIOPSY Right    BREAST LUMPECTOMY Left 06/13/2018   Procedure: LEFT RETROAREOLAR  LUMPECTOMY;  Surgeon: Donnie Mesa, MD;  Location: [facility];  Service: General;  Laterality: Left;   COLONOSCOPY  2007   CYSTOSCOPY N/A 05/04/2015   Procedure: CYSTOSCOPY;  Surgeon: Linda Hedges, DO;  Location: [facility];  Service: Gynecology;  Laterality: N/A;   DILATATION & CURETTAGE/HYSTEROSCOPY WITH TRUECLEAR N/A 10/22/2013   Procedure: DILATATION &  CURETTAGE/HYSTEROSCOPY WITH TRUCLEAR;  Surgeon: Linda Hedges, DO;  Location: [facility];  Service: Gynecology;  Laterality: N/A;   LAPAROSCOPIC ASSISTED VAGINAL HYSTERECTOMY N/A 05/04/2015  Procedure: LAPAROSCOPIC ASSISTED VAGINAL HYSTERECTOMY;  Surgeon: Linda Hedges, DO;  Location: [facility];  Service: Gynecology;  Laterality: N/A;  need bed   Social History   Tobacco Use   Smoking status: Former    Years: 2.00    Types: Cigarettes    Quit date: 07/31/1990    Years since quitting: 30.8   Smokeless tobacco: Never  Vaping Use   Vaping Use: Never used  Substance Use Topics   Alcohol use: Yes    Alcohol/week: 0.0 standard drinks    Comment: rare   Drug use: No   Family History  Problem Relation Age of Onset   Cancer Father        colon   Colon cancer Father 43   Hyperlipidemia Mother    Colon cancer Paternal Uncle 63   Heart Problems Maternal Grandfather    Esophageal cancer Neg Hx    Stomach cancer Neg Hx    Rectal cancer Neg Hx    Allergies  Allergen Reactions   Erythromycin Rash   Penicillins Rash    06/13/18 received 2 Gms Ancef with no apparent reaction   Current Outpatient Medications on File Prior to Visit  Medication Sig Dispense Refill   docusate sodium (COLACE) 100 MG capsule Take 1 capsule (100 mg total) by mouth 2 (two) times daily. (Patient taking differently: Take 100 mg by mouth daily.) 60 capsule 1   ibuprofen (ADVIL,MOTRIN) 800 MG tablet Take 1 tablet (800 mg total) by mouth every 8 (eight) hours as needed. 30 tablet 0   Probiotic Product (PROBIOTIC DAILY PO) Take 1 capsule by mouth daily.     No current facility-administered medications on file prior to visit.    Review of Systems  Constitutional:  Negative for activity change, appetite change, fatigue, fever and unexpected weight change.  HENT:  Negative for congestion, ear pain, rhinorrhea, sinus pressure and sore throat.   Eyes:  Negative for pain, redness and visual disturbance.   Respiratory:  Negative for cough, shortness of breath and wheezing.   Cardiovascular:  Negative for chest pain and palpitations.  Gastrointestinal:  Negative for abdominal pain, blood in stool, constipation and diarrhea.  Endocrine: Negative for polydipsia and polyuria.  Genitourinary:  Negative for dysuria, frequency and urgency.  Musculoskeletal:  Negative for arthralgias, back pain and myalgias.  Skin:  Negative for pallor and rash.  Allergic/Immunologic: Negative for environmental allergies.  Neurological:  Negative for dizziness, syncope and headaches.  Hematological:  Negative for adenopathy. Does not bruise/bleed easily.  Psychiatric/Behavioral:  Negative for decreased concentration and dysphoric mood. The patient is not nervous/anxious.       Objective:   Physical Exam Constitutional:      General: She is not in acute distress.    Appearance: Normal appearance. She is well-developed. She is obese. She is not ill-appearing or diaphoretic.  HENT:     Head: Normocephalic and atraumatic.     Right Ear: Tympanic membrane, ear canal and external ear normal.     Left Ear: Tympanic membrane, ear canal and external ear normal.     Nose: Nose normal. No congestion.     Mouth/Throat:     Mouth: Mucous membranes are moist.     Pharynx: Oropharynx is clear. No posterior oropharyngeal erythema.  Eyes:     General: No scleral icterus.    Extraocular Movements: Extraocular movements intact.     Conjunctiva/sclera: Conjunctivae normal.     Pupils: Pupils are equal, round, and reactive to light.  Neck:     Thyroid: No thyromegaly.     Vascular: No carotid bruit or JVD.  Cardiovascular:     Rate and Rhythm: Normal rate and regular rhythm.     Pulses: Normal pulses.     Heart sounds: Normal heart sounds.    No gallop.  Pulmonary:     Effort: Pulmonary effort is normal. No respiratory distress.     Breath sounds: Normal breath sounds. No wheezing.     Comments: Good air exch Chest:      Chest wall: No tenderness.  Abdominal:     General: Bowel sounds are normal. There is no distension or abdominal bruit.     Palpations: Abdomen is soft. There is no mass.     Tenderness: There is no abdominal tenderness.     Hernia: No hernia is present.  Genitourinary:    Comments: Breast exam: No mass, nodules, thickening, tenderness, bulging, retraction, inflamation, nipple discharge or skin changes noted.  No axillary or clavicular LA.     Musculoskeletal:        General: No tenderness. Normal range of motion.     Cervical back: Normal range of motion and neck supple. No rigidity. No muscular tenderness.     Right lower leg: No edema.     Left lower leg: No edema.  Lymphadenopathy:     Cervical: No cervical adenopathy.  Skin:    General: Skin is warm and dry.     Coloration: Skin is not pale.     Findings: No erythema or rash.     Comments: Solar lentigines diffusely   Neurological:     Mental Status: She is alert. Mental status is at baseline.     Cranial Nerves: No cranial nerve deficit.     Motor: No abnormal muscle tone.     Coordination: Coordination normal.     Gait: Gait normal.     Deep Tendon Reflexes: Reflexes are normal and symmetric. Reflexes normal.  Psychiatric:        Mood and Affect: Mood normal.        Cognition and Memory: Cognition and memory normal.          Assessment & Plan:   Problem List Items Addressed This Visit       Other   Routine general medical examination at a health care facility - Primary    Reviewed health habits including diet and exercise and skin cancer prevention Reviewed appropriate screening tests for age  Also reviewed health mt list, fam hx and immunization status , as well as social and family history   See HPI Labs reviewed  Declines shingrix and flu vaccine (may get later) covid immunized  Mammogram due, given info to schedule  Colonoscopy is up to date  HIV and hep c screens are negative Recommend ca and D for  bone health      Family history of colon cancer    Colonoscopy utd in 2019 with 5 y recall      Elevated blood sugar    Lab Results  Component Value Date   HGBA1C 5.9 04/01/2021  Stable disc imp of low glycemic diet and wt loss to prevent DM2

## 2021-06-03 NOTE — Patient Instructions (Addendum)
Call and schedule your mammogram   Take care of yourself   Avoid red meat/ fried foods/ egg yolks/ fatty breakfast meats/ butter, cheese and high fat dairy/ and shellfish   Add exercise when you can     Please call the location of your choice from the menu below to schedule your Mammogram and/or Bone Density appointment.    [facility]                      Phone:  [phone] [address], [place] [address]                                                             Services: Traditional and 3D Mammogram, [facility]                 Phone: [phone] [address], [place] [address]    Service: Bone Density ONLY   *this site does NOT perform mammograms  [facility]                        Phone:  [phone] [address], [place] [address]                                            Services:  3D Mammogram and [place] at [facility]   Phone:  [phone]   [place], [place] [address]                                            Services: 3D Mammogram and [place]  [place] at [facility])  Phone:  [phone]   [address]  [place], [place] [address]                                              Services:  3D Mammogram and Bone Density  

## 2021-06-04 NOTE — Assessment & Plan Note (Signed)
Colonoscopy utd in 2019 with 5 y recall

## 2021-06-04 NOTE — Assessment & Plan Note (Addendum)
Reviewed health habits including diet and exercise and skin cancer prevention Reviewed appropriate screening tests for age  Also reviewed health mt list, fam hx and immunization status , as well as social and family history   See HPI Labs reviewed  Declines shingrix and flu vaccine (may get later) covid immunized  Mammogram due, given info to schedule  Colonoscopy is up to date  HIV and hep c screens are negative Recommend ca and D for bone health

## 2021-06-04 NOTE — Assessment & Plan Note (Signed)
Lab Results  Component Value Date   HGBA1C 5.9 04/01/2021   Stable disc imp of low glycemic diet and wt loss to prevent DM2

## 2021-12-21 ENCOUNTER — Ambulatory Visit: Payer: BC Managed Care – PPO | Admitting: Nurse Practitioner

## 2021-12-21 ENCOUNTER — Telehealth: Payer: Self-pay

## 2021-12-21 NOTE — Telephone Encounter (Signed)
Per appt notes pt has already cancelled appt. Sending note to Anastasiya CMA if any additional chg.

## 2021-12-21 NOTE — Telephone Encounter (Signed)
[facility] Night - Client Nonclinical Telephone Record  [facility] Client [facility] Night - Client Client Site [place] - Night Provider Romilda Garret- NP Contact Type Call Who Is Calling Patient / Member / Family / Caregiver Caller Name Hennesy Sobalvarro Caller Phone Number [phone] Patient Name Dala Breault Patient DOB [birth year] Call Type Message Only Information Provided Reason for Call Request to [place] Appointment Initial Comment Caller states that he needs to cancel his wife appointment for tomorrow. Patient request to speak to RN No Additional Comment Provided caller with office hours. Disp. Time Disposition Final User 12/20/2021 5:23:54 PM General Information Provided Yes Sherre Lain Call Closed By: Sherre Lain Transaction Date/Time: 12/20/2021 5:17:23 PM (ET

## 2022-04-25 ENCOUNTER — Other Ambulatory Visit: Payer: Self-pay | Admitting: Family Medicine

## 2022-04-25 DIAGNOSIS — Z1231 Encounter for screening mammogram for malignant neoplasm of breast: Secondary | ICD-10-CM

## 2022-05-05 ENCOUNTER — Ambulatory Visit
Admission: RE | Admit: 2022-05-05 | Discharge: 2022-05-05 | Disposition: A | Discharge status: Home / Self Care | Payer: BC Managed Care – PPO | Source: Ambulatory Visit | Attending: Family Medicine | Admitting: Family Medicine

## 2022-05-05 ENCOUNTER — Other Ambulatory Visit: Payer: BC Managed Care – PPO

## 2022-05-05 DIAGNOSIS — Z1231 Encounter for screening mammogram for malignant neoplasm of breast: Secondary | ICD-10-CM | POA: Diagnosis not present

## 2022-05-08 ENCOUNTER — Other Ambulatory Visit: Payer: Self-pay | Admitting: Family Medicine

## 2022-05-08 DIAGNOSIS — R928 Other abnormal and inconclusive findings on diagnostic imaging of breast: Secondary | ICD-10-CM

## 2022-05-12 ENCOUNTER — Other Ambulatory Visit (INDEPENDENT_AMBULATORY_CARE_PROVIDER_SITE_OTHER): Payer: BC Managed Care – PPO

## 2022-05-12 ENCOUNTER — Encounter: Payer: BC Managed Care – PPO | Admitting: Family Medicine

## 2022-05-12 ENCOUNTER — Other Ambulatory Visit: Payer: Self-pay | Admitting: Family Medicine

## 2022-05-12 DIAGNOSIS — D509 Iron deficiency anemia, unspecified: Secondary | ICD-10-CM

## 2022-05-12 DIAGNOSIS — R739 Hyperglycemia, unspecified: Secondary | ICD-10-CM

## 2022-05-12 LAB — LIPID PANEL
Cholesterol: 197 mg/dL (ref 0–200)
HDL: 48.6 mg/dL (ref >39.00)
LDL Cholesterol: 127 mg/dL — ABNORMAL HIGH (ref 0–99)
NonHDL: 148.44
Total CHOL/HDL Ratio: 4
Triglycerides: 105 mg/dL (ref 0.0–149.0)
VLDL: 21 mg/dL (ref 0.0–40.0)

## 2022-05-12 LAB — COMPREHENSIVE METABOLIC PANEL WITH GFR
ALT: 27 U/L (ref 0–35)
AST: 20 U/L (ref 0–37)
Albumin: 4.3 g/dL (ref 3.5–5.2)
Alkaline Phosphatase: 56 U/L (ref 39–117)
BUN: 22 mg/dL (ref 6–23)
CO2: 29 meq/L (ref 19–32)
Calcium: 9.2 mg/dL (ref 8.4–10.5)
Chloride: 104 meq/L (ref 96–112)
Creatinine, Ser: 0.86 mg/dL (ref 0.40–1.20)
GFR: 76.16 mL/min
Glucose, Bld: 99 mg/dL (ref 70–99)
Potassium: 4.6 meq/L (ref 3.5–5.1)
Sodium: 139 meq/L (ref 135–145)
Total Bilirubin: 0.6 mg/dL (ref 0.2–1.2)
Total Protein: 6.6 g/dL (ref 6.0–8.3)

## 2022-05-12 LAB — CBC WITH DIFFERENTIAL/PLATELET
Basophils Absolute: 0 10*3/uL (ref 0.0–0.1)
Basophils Relative: 0.8 % (ref 0.0–3.0)
Eosinophils Absolute: 0.1 10*3/uL (ref 0.0–0.7)
Eosinophils Relative: 2 % (ref 0.0–5.0)
HCT: 39.2 % (ref 36.0–46.0)
Hemoglobin: 13 g/dL (ref 12.0–15.0)
Lymphocytes Relative: 27.4 % (ref 12.0–46.0)
Lymphs Abs: 1.3 10*3/uL (ref 0.7–4.0)
MCHC: 33.2 g/dL (ref 30.0–36.0)
MCV: 85.7 fl (ref 78.0–100.0)
Monocytes Absolute: 0.4 10*3/uL (ref 0.1–1.0)
Monocytes Relative: 7.8 % (ref 3.0–12.0)
Neutro Abs: 3 10*3/uL (ref 1.4–7.7)
Neutrophils Relative %: 62 % (ref 43.0–77.0)
Platelets: 230 10*3/uL (ref 150.0–400.0)
RBC: 4.57 Mil/uL (ref 3.87–5.11)
RDW: 13.5 % (ref 11.5–15.5)
WBC: 4.9 10*3/uL (ref 4.0–10.5)

## 2022-05-12 LAB — FERRITIN: Ferritin: 68.1 ng/mL (ref 10.0–291.0)

## 2022-05-12 LAB — IBC PANEL
Iron: 108 ug/dL (ref 42–145)
Saturation Ratios: 35.1 % (ref 20.0–50.0)
TIBC: 308 ug/dL (ref 250.0–450.0)
Transferrin: 220 mg/dL (ref 212.0–360.0)

## 2022-05-12 LAB — HEMOGLOBIN A1C: Hgb A1c MFr Bld: 6.1 % (ref 4.6–6.5)

## 2022-05-13 ENCOUNTER — Telehealth: Payer: Self-pay | Admitting: Family Medicine

## 2022-05-13 DIAGNOSIS — Z0000 Encounter for general adult medical examination without abnormal findings: Secondary | ICD-10-CM

## 2022-05-13 DIAGNOSIS — R739 Hyperglycemia, unspecified: Secondary | ICD-10-CM

## 2022-05-13 NOTE — Telephone Encounter (Signed)
-----   Message from Ellamae Sia sent at 05/05/2022  3:24 PM EDT ----- Regarding: Lab orders for Monday, 10.16.23 Patient is scheduled for CPX labs, please order future labs, Thanks , Karna Christmas

## 2022-05-19 ENCOUNTER — Ambulatory Visit (INDEPENDENT_AMBULATORY_CARE_PROVIDER_SITE_OTHER): Payer: BC Managed Care – PPO | Admitting: Family Medicine

## 2022-05-19 ENCOUNTER — Encounter: Payer: Self-pay | Admitting: Family Medicine

## 2022-05-19 VITALS — BP 112/68 | HR 68 | Temp 97.3°F | Ht 64.5 in | Wt 189.4 lb

## 2022-05-19 DIAGNOSIS — L309 Dermatitis, unspecified: Secondary | ICD-10-CM | POA: Diagnosis not present

## 2022-05-19 DIAGNOSIS — R7303 Prediabetes: Secondary | ICD-10-CM | POA: Diagnosis not present

## 2022-05-19 DIAGNOSIS — E7800 Pure hypercholesterolemia, unspecified: Secondary | ICD-10-CM

## 2022-05-19 DIAGNOSIS — Z0000 Encounter for general adult medical examination without abnormal findings: Secondary | ICD-10-CM

## 2022-05-19 DIAGNOSIS — E785 Hyperlipidemia, unspecified: Secondary | ICD-10-CM | POA: Insufficient documentation

## 2022-05-19 MED ORDER — TRIAMCINOLONE ACETONIDE 0.5 % EX CREA: 1.0000 | TOPICAL_CREAM | Freq: Two times a day (BID) | CUTANEOUS | 3 refills | Status: DC | PRN

## 2022-05-19 NOTE — Assessment & Plan Note (Signed)
Mild  Disc goals for lipids and reasons to control them Rev last labs with pt Rev low sat fat diet in detail LDL of 127  Had coronary ca score of 0 in 2020-reassuring  Urged her to watch diet more closely to get LDL under 100

## 2022-05-19 NOTE — Assessment & Plan Note (Signed)
Lab Results  Component Value Date   HGBA1C 6.1 05/12/2022   disc imp of low glycemic diet and wt loss to prevent DM2

## 2022-05-19 NOTE — Progress Notes (Signed)
Subjective:    Patient ID: Katie Espinoza, female    DOB: Sep 06, 1965, 56 y.o.   MRN: [id number]  HPI Here for health maintenance exam and to review chronic medical problems    Wt Readings from Last 3 Encounters:  05/19/22 189 lb 6 oz (85.9 kg)  06/03/21 185 lb (83.9 kg)  04/09/20 179 lb 1 oz (81.2 kg)   32.00 kg/m  Feeling fine  Has gained some weight  Eating things she was not in the past Less time to walk- with work schedule (this is frustrating)  Hard to drink water during the day  Is trying to make some changes in the office    Immunization History  Administered Date(s) Administered   Influenza Whole 05/28/1998, 05/16/2012   Influenza,inj,Quad PF,6+ Mos 07/03/2014   Influenza-Unspecified 05/14/2013   PFIZER(Purple Top)SARS-COV-2 Vaccination 04/02/2020   Td 01/29/2003, 01/24/2010, 04/09/2020   Health Maintenance Due  Topic Date Due   Zoster Vaccines- Shingrix (1 of 2) Never done    Shingrix declines Flu shot declines   Colonosocpy 10/2017 with 5 y recall Father had colon cancer    Mammogram 04/2022 - asymmetry in R breast is going for a re check with [place] Has had re checks in the past and had a lumpectomy in the past  Self breast exam : no lumps  No fam h/o breast cancer   Ca and D intake -not taking   BP Readings from Last 3 Encounters:  05/19/22 112/68  06/03/21 114/68  04/09/20 118/78   Pulse Readings from Last 3 Encounters:  05/19/22 68  06/03/21 65  04/09/20 81   Lab Results  Component Value Date   HGBA1C 6.1 05/12/2022  Up from 5.9   Is eating worse - has access to sweets at work more so  Felt better on lowe sugar    Cholesterol Lab Results  Component Value Date   CHOL 197 05/12/2022   CHOL 194 04/01/2021   CHOL 170 04/02/2020   Lab Results  Component Value Date   HDL 48.60 05/12/2022   HDL 51.70 04/01/2021   HDL 53.40 04/02/2020   Lab Results  Component Value Date   LDLCALC 127 (H) 05/12/2022   LDLCALC 125 (H) 04/01/2021    LDLCALC 103 (H) 04/02/2020   Lab Results  Component Value Date   TRIG 105.0 05/12/2022   TRIG 86.0 04/01/2021   TRIG 69.0 04/02/2020   Lab Results  Component Value Date   CHOLHDL 4 05/12/2022   CHOLHDL 4 04/01/2021   CHOLHDL 3 04/02/2020   Lab Results  Component Value Date   LDLDIRECT 107.7 07/14/2013   HDL is down    The 10-year ASCVD risk score (Arnett DK, et al., 2019) is: 1.7%   Values used to calculate the score:     Age: 89 years     Sex: Female     Is Non-Hispanic African American: No     Diabetic: No     Tobacco smoker: No     Systolic Blood Pressure: 409 mmHg     Is BP treated: No     HDL Cholesterol: 48.6 mg/dL     Total Cholesterol: 197 mg/dL  Lab Results  Component Value Date   CREATININE 0.86 05/12/2022   BUN 22 05/12/2022   NA 139 05/12/2022   K 4.6 05/12/2022   CL 104 05/12/2022   CO2 29 05/12/2022   Lab Results  Component Value Date   ALT 27 05/12/2022   AST 20 05/12/2022  ALKPHOS 56 05/12/2022   BILITOT 0.6 05/12/2022   Lab Results  Component Value Date   WBC 4.9 05/12/2022   HGB 13.0 05/12/2022   HCT 39.2 05/12/2022   MCV 85.7 05/12/2022   PLT 230.0 05/12/2022   Lab Results  Component Value Date   TSH 2.28 04/01/2021   Lab Results  Component Value Date   IRON 108 05/12/2022   TIBC 308.0 05/12/2022   FERRITIN 68.1 05/12/2022     Patient Active Problem List   Diagnosis Date Noted   Hyperlipidemia 05/19/2022   Low back pain 03/19/2020   Prediabetes 03/29/2018   S/P hysterectomy 05/04/2015   Other screening mammogram 05/17/2012   Anemia, iron deficiency 05/08/2011   Family history of colon cancer 05/08/2011   Encounter for routine gynecological examination 05/08/2011   Routine general medical examination at a health care facility 05/01/2011   ACTINIC KERATOSIS 07/26/2010   MIGRAINE HEADACHE 02/14/2007   RHINITIS, ALLERGIC NEC 02/14/2007   Past Medical History:  Diagnosis Date   Chronic constipation    Heavy  menstrual bleeding    Iron deficiency anemia    IV Iron infusions--  last infusion 09-23-22016   Migraine    PONV (postoperative nausea and vomiting)    Uterine fibroid    Wears glasses    Past Surgical History:  Procedure Laterality Date   ABDOMINAL HYSTERECTOMY     BILATERAL SALPINGECTOMY Bilateral 05/04/2015   Procedure: BILATERAL SALPINGECTOMY;  Surgeon: Linda Hedges, DO;  Location: [place];  Service: Gynecology;  Laterality: Bilateral;   BREAST BIOPSY Left 2017   BREAST BIOPSY Right    BREAST LUMPECTOMY Left 06/13/2018   Procedure: LEFT RETROAREOLAR  LUMPECTOMY;  Surgeon: Donnie Mesa, MD;  Location: [place];  Service: General;  Laterality: Left;   COLONOSCOPY  2007   CYSTOSCOPY N/A 05/04/2015   Procedure: CYSTOSCOPY;  Surgeon: Linda Hedges, DO;  Location: [place];  Service: Gynecology;  Laterality: N/A;   DILATATION & CURETTAGE/HYSTEROSCOPY WITH TRUECLEAR N/A 10/22/2013   Procedure: DILATATION & CURETTAGE/HYSTEROSCOPY WITH TRUCLEAR;  Surgeon: Linda Hedges, DO;  Location: [facility];  Service: Gynecology;  Laterality: N/A;   LAPAROSCOPIC ASSISTED VAGINAL HYSTERECTOMY N/A 05/04/2015   Procedure: LAPAROSCOPIC ASSISTED VAGINAL HYSTERECTOMY;  Surgeon: Linda Hedges, DO;  Location: [place];  Service: Gynecology;  Laterality: N/A;  need bed   Social History   Tobacco Use   Smoking status: Former    Years: 2.00    Types: Cigarettes    Quit date: 07/31/1990    Years since quitting: 31.8   Smokeless tobacco: Never  Vaping Use   Vaping Use: Never used  Substance Use Topics   Alcohol use: Yes    Alcohol/week: 0.0 standard drinks of alcohol    Comment: rare   Drug use: No   Family History  Problem Relation Age of Onset   Cancer Father        colon   Colon cancer Father 49   Hyperlipidemia Mother    Colon cancer Paternal Uncle 31   Heart Problems Maternal Grandfather    Esophageal cancer Neg Hx    Stomach  cancer Neg Hx    Rectal cancer Neg Hx    Allergies  Allergen Reactions   Erythromycin Rash   Penicillins Rash    06/13/18 received 2 Gms Ancef with no apparent reaction   Current Outpatient Medications on File Prior to Visit  Medication Sig Dispense Refill   Probiotic Product (PROBIOTIC DAILY PO)  Take 1 capsule by mouth daily.     No current facility-administered medications on file prior to visit.     Review of Systems  Constitutional:  Negative for activity change, appetite change, fatigue, fever and unexpected weight change.  HENT:  Negative for congestion, ear pain, rhinorrhea, sinus pressure and sore throat.   Eyes:  Negative for pain, redness and visual disturbance.  Respiratory:  Negative for cough, shortness of breath and wheezing.   Cardiovascular:  Negative for chest pain and palpitations.  Gastrointestinal:  Negative for abdominal pain, blood in stool, constipation and diarrhea.  Endocrine: Negative for polydipsia and polyuria.  Genitourinary:  Negative for dysuria, frequency and urgency.  Musculoskeletal:  Negative for arthralgias, back pain and myalgias.  Skin:  Negative for pallor and rash.  Allergic/Immunologic: Negative for environmental allergies.  Neurological:  Negative for dizziness, syncope and headaches.  Hematological:  Negative for adenopathy. Does not bruise/bleed easily.  Psychiatric/Behavioral:  Negative for decreased concentration and dysphoric mood. The patient is not nervous/anxious.        Objective:   Physical Exam Constitutional:      General: She is not in acute distress.    Appearance: Normal appearance. She is well-developed. She is obese. She is not ill-appearing or diaphoretic.  HENT:     Head: Normocephalic and atraumatic.     Right Ear: Tympanic membrane, ear canal and external ear normal.     Left Ear: Tympanic membrane, ear canal and external ear normal.     Nose: Nose normal. No congestion.     Mouth/Throat:     Mouth: Mucous  membranes are moist.     Pharynx: Oropharynx is clear. No posterior oropharyngeal erythema.  Eyes:     General: No scleral icterus.    Extraocular Movements: Extraocular movements intact.     Conjunctiva/sclera: Conjunctivae normal.     Pupils: Pupils are equal, round, and reactive to light.  Neck:     Thyroid: No thyromegaly.     Vascular: No carotid bruit or JVD.  Cardiovascular:     Rate and Rhythm: Normal rate and regular rhythm.     Pulses: Normal pulses.     Heart sounds: Normal heart sounds.     No gallop.  Pulmonary:     Effort: Pulmonary effort is normal. No respiratory distress.     Breath sounds: Normal breath sounds. No wheezing.     Comments: Good air exch Chest:     Chest wall: No tenderness.  Abdominal:     General: Bowel sounds are normal. There is no distension or abdominal bruit.     Palpations: Abdomen is soft. There is no mass.     Tenderness: There is no abdominal tenderness.     Hernia: No hernia is present.  Genitourinary:    Comments: Breast exam: No mass, nodules, thickening, tenderness, bulging, retraction, inflamation, nipple discharge or skin changes noted.  No axillary or clavicular LA.     Baseline scars from lumpectomy noted  Musculoskeletal:        General: No tenderness. Normal range of motion.     Cervical back: Normal range of motion and neck supple. No rigidity. No muscular tenderness.     Right lower leg: No edema.     Left lower leg: No edema.     Comments: No kyphosis   Lymphadenopathy:     Cervical: No cervical adenopathy.  Skin:    General: Skin is warm and dry.     Coloration: Skin is  not pale.     Findings: No erythema or rash.     Comments: Solar lentigines diffusely   Neurological:     Mental Status: She is alert. Mental status is at baseline.     Cranial Nerves: No cranial nerve deficit.     Motor: No abnormal muscle tone.     Coordination: Coordination normal.     Gait: Gait normal.     Deep Tendon Reflexes: Reflexes are  normal and symmetric. Reflexes normal.  Psychiatric:        Mood and Affect: Mood normal.        Cognition and Memory: Cognition and memory normal.           Assessment & Plan:   Problem List Items Addressed This Visit       Other   Hyperlipidemia    Mild  Disc goals for lipids and reasons to control them Rev last labs with pt Rev low sat fat diet in detail LDL of 127  Had coronary ca score of 0 in 2020-reassuring  Urged her to watch diet more closely to get LDL under 100      Prediabetes    Lab Results  Component Value Date   HGBA1C 6.1 05/12/2022  disc imp of low glycemic diet and wt loss to prevent DM2        Routine general medical examination at a health care facility - Primary    Reviewed health habits including diet and exercise and skin cancer prevention Reviewed appropriate screening tests for age  Also reviewed health mt list, fam hx and immunization status , as well as social and family history   See HPI Declines flu and shingles vaccines  Colonoscopy utd 10/2017 with 5 y recall  (fam hx) Mammogram 04/2022-pending re check/US for asymmetry in R breast  Enc to supplement ca and D for bone health  Enc strength training

## 2022-05-19 NOTE — Assessment & Plan Note (Signed)
Reviewed health habits including diet and exercise and skin cancer prevention Reviewed appropriate screening tests for age  Also reviewed health mt list, fam hx and immunization status , as well as social and family history   See HPI Declines flu and shingles vaccines  Colonoscopy utd 10/2017 with 5 y recall  (fam hx) Mammogram 04/2022-pending re check/US for asymmetry in R breast  Enc to supplement ca and D for bone health  Enc strength training

## 2022-05-19 NOTE — Patient Instructions (Addendum)
Try to get 1200-1500 mg of calcium per day with at least 1000 iu of vitamin D - for bone health  Add some resistance training - weights are great   Try to get most of your carbohydrates from produce (with the exception of white potatoes)  Eat less bread/pasta/rice/snack foods/cereals/sweets and other items from the middle of the grocery store (processed carbs)   For cholesterol Avoid red meat/ fried foods/ egg yolks/ fatty breakfast meats/ butter, cheese and high fat dairy/ and shellfish   Take care of yourself

## 2022-05-19 NOTE — Assessment & Plan Note (Signed)
Refilled triamcinolone cream for eczema/dermatitis areas

## 2022-06-16 ENCOUNTER — Ambulatory Visit
Admission: RE | Admit: 2022-06-16 | Discharge: 2022-06-16 | Disposition: A | Discharge status: Home / Self Care | Payer: BC Managed Care – PPO | Source: Ambulatory Visit | Attending: Family Medicine | Admitting: Family Medicine

## 2022-06-16 ENCOUNTER — Ambulatory Visit: Admission: RE | Admit: 2022-06-16 | Payer: BC Managed Care – PPO | Source: Ambulatory Visit

## 2022-06-16 ENCOUNTER — Other Ambulatory Visit: Payer: Self-pay | Admitting: Family Medicine

## 2022-06-16 DIAGNOSIS — R928 Other abnormal and inconclusive findings on diagnostic imaging of breast: Secondary | ICD-10-CM

## 2022-06-16 DIAGNOSIS — N6489 Other specified disorders of breast: Secondary | ICD-10-CM | POA: Diagnosis not present

## 2022-06-16 DIAGNOSIS — N6320 Unspecified lump in the left breast, unspecified quadrant: Secondary | ICD-10-CM

## 2022-06-21 ENCOUNTER — Ambulatory Visit
Admission: RE | Admit: 2022-06-21 | Discharge: 2022-06-21 | Disposition: A | Discharge status: Home / Self Care | Payer: BC Managed Care – PPO | Source: Ambulatory Visit | Attending: Family Medicine | Admitting: Family Medicine

## 2022-06-21 DIAGNOSIS — N6320 Unspecified lump in the left breast, unspecified quadrant: Secondary | ICD-10-CM

## 2022-06-21 DIAGNOSIS — N6012 Diffuse cystic mastopathy of left breast: Secondary | ICD-10-CM | POA: Diagnosis not present

## 2022-06-21 DIAGNOSIS — N6321 Unspecified lump in the left breast, upper outer quadrant: Secondary | ICD-10-CM | POA: Diagnosis not present

## 2022-06-21 HISTORY — PX: BREAST BIOPSY: SHX20

## 2022-11-29 ENCOUNTER — Encounter: Payer: Self-pay | Admitting: Gastroenterology

## 2024-07-11 ENCOUNTER — Other Ambulatory Visit: Payer: Self-pay | Admitting: Otolaryngology

## 2024-07-30 ENCOUNTER — Encounter: Payer: Self-pay | Admitting: Hematology and Oncology

## 2024-08-14 ENCOUNTER — Encounter: Payer: Self-pay | Admitting: Otolaryngology

## 2024-08-14 NOTE — Discharge Instructions (Signed)

## 2024-08-18 ENCOUNTER — Encounter: Payer: Self-pay | Admitting: Hematology and Oncology

## 2024-08-19 ENCOUNTER — Ambulatory Visit: Payer: Self-pay

## 2024-08-19 ENCOUNTER — Other Ambulatory Visit: Payer: Self-pay

## 2024-08-19 ENCOUNTER — Ambulatory Visit
Admission: RE | Admit: 2024-08-19 | Discharge: 2024-08-19 | Disposition: A | Discharge status: Home / Self Care | Attending: Otolaryngology | Admitting: Otolaryngology

## 2024-08-19 ENCOUNTER — Encounter: Payer: Self-pay | Admitting: Otolaryngology

## 2024-08-19 ENCOUNTER — Encounter
Admission: RE | Disposition: A | Discharge status: Home / Self Care | Payer: Self-pay | Source: Home / Self Care | Attending: Otolaryngology

## 2024-08-19 DIAGNOSIS — J320 Chronic maxillary sinusitis: Secondary | ICD-10-CM | POA: Insufficient documentation

## 2024-08-19 DIAGNOSIS — J341 Cyst and mucocele of nose and nasal sinus: Secondary | ICD-10-CM | POA: Diagnosis not present

## 2024-08-19 DIAGNOSIS — Z87891 Personal history of nicotine dependence: Secondary | ICD-10-CM | POA: Diagnosis not present

## 2024-08-19 HISTORY — DX: Unspecified osteoarthritis, unspecified site: M19.90

## 2024-08-19 HISTORY — PX: MAXILLARY ANTROSTOMY: SHX2003

## 2024-08-19 HISTORY — PX: IMAGE GUIDED SINUS SURGERY: SHX6570

## 2024-08-19 MED ORDER — PROPOFOL 10 MG/ML IV BOLUS
INTRAVENOUS | Status: DC | PRN
  Administered 2024-08-19: 130 mg via INTRAVENOUS

## 2024-08-19 MED ORDER — LIDOCAINE HCL (CARDIAC) PF 100 MG/5ML IV SOSY
PREFILLED_SYRINGE | INTRAVENOUS | Status: DC | PRN
  Administered 2024-08-19: 50 mg via INTRAVENOUS

## 2024-08-19 MED ORDER — ACETAMINOPHEN 10 MG/ML IV SOLN
1000.0000 mg | Freq: Once | INTRAVENOUS | Status: DC | PRN
  Administered 2024-08-19: 1000 mg via INTRAVENOUS

## 2024-08-19 MED ORDER — LIDOCAINE-EPINEPHRINE 1 %-1:100000 IJ SOLN
INTRAMUSCULAR | Status: DC | PRN
  Administered 2024-08-19: 7 mL

## 2024-08-19 MED ORDER — LIDOCAINE HCL 4 % MT SOLN
OROMUCOSAL | Status: AC
  Filled 2024-08-19: qty 4

## 2024-08-19 MED ORDER — EPHEDRINE SULFATE (PRESSORS) 25 MG/5ML IV SOSY
PREFILLED_SYRINGE | INTRAVENOUS | Status: DC | PRN
  Administered 2024-08-19: 5 mg via INTRAVENOUS
  Administered 2024-08-19: 10 mg via INTRAVENOUS

## 2024-08-19 MED ORDER — DEXAMETHASONE SODIUM PHOSPHATE 4 MG/ML IJ SOLN
INTRAMUSCULAR | Status: DC | PRN
  Administered 2024-08-19: 10 mg via INTRAVENOUS

## 2024-08-19 MED ORDER — OXYMETAZOLINE HCL 0.05 % NA SOLN
NASAL | Status: DC | PRN
  Administered 2024-08-19: 1 via TOPICAL

## 2024-08-19 MED ORDER — MIDAZOLAM HCL 2 MG/2ML IJ SOLN
INTRAMUSCULAR | Status: AC
  Filled 2024-08-19: qty 2

## 2024-08-19 MED ORDER — MIDAZOLAM HCL 5 MG/5ML IJ SOLN
INTRAMUSCULAR | Status: DC | PRN
  Administered 2024-08-19: 2 mg via INTRAVENOUS

## 2024-08-19 MED ORDER — ACETAMINOPHEN 10 MG/ML IV SOLN
INTRAVENOUS | Status: AC
  Filled 2024-08-19: qty 100

## 2024-08-19 MED ORDER — LIDOCAINE HCL 4 % EX SOLN
CUTANEOUS | Status: DC | PRN
  Administered 2024-08-19: 4 mL via TOPICAL

## 2024-08-19 MED ORDER — DOXYCYCLINE HYCLATE 100 MG PO CAPS: ORAL_CAPSULE | ORAL | 0 refills | Status: AC

## 2024-08-19 MED ORDER — LACTATED RINGERS IV SOLN: INTRAVENOUS | Status: DC

## 2024-08-19 MED ORDER — FENTANYL CITRATE (PF) 50 MCG/ML IJ SOSY: 25.0000 ug | PREFILLED_SYRINGE | INTRAMUSCULAR | Status: DC | PRN

## 2024-08-19 MED ORDER — SUCCINYLCHOLINE CHLORIDE 200 MG/10ML IV SOSY
PREFILLED_SYRINGE | INTRAVENOUS | Status: DC | PRN
  Administered 2024-08-19: 100 mg via INTRAVENOUS

## 2024-08-19 MED ORDER — SCOPOLAMINE 1 MG/3DAYS TD PT72
1.0000 | MEDICATED_PATCH | Freq: Once | TRANSDERMAL | Status: DC
  Administered 2024-08-19: 1 mg via TRANSDERMAL

## 2024-08-19 MED ORDER — ONDANSETRON HCL 4 MG/2ML IJ SOLN
4.0000 mg | Freq: Once | INTRAMUSCULAR | Status: AC | PRN
  Administered 2024-08-19: 4 mg via INTRAVENOUS

## 2024-08-19 MED ORDER — GLYCOPYRROLATE 0.2 MG/ML IJ SOLN
INTRAMUSCULAR | Status: DC | PRN
  Administered 2024-08-19: .1 mg via INTRAVENOUS

## 2024-08-19 MED ORDER — FENTANYL CITRATE (PF) 100 MCG/2ML IJ SOLN
INTRAMUSCULAR | Status: DC | PRN
  Administered 2024-08-19: 25 ug via INTRAVENOUS
  Administered 2024-08-19: 50 ug via INTRAVENOUS

## 2024-08-19 MED ORDER — SCOPOLAMINE 1 MG/3DAYS TD PT72
MEDICATED_PATCH | TRANSDERMAL | Status: AC
  Filled 2024-08-19: qty 1

## 2024-08-19 MED ORDER — HYDROCODONE-ACETAMINOPHEN 5-325 MG PO TABS: 1.0000 | ORAL_TABLET | Freq: Four times a day (QID) | ORAL | 0 refills | Status: AC | PRN

## 2024-08-19 MED ORDER — PROPOFOL 10 MG/ML IV BOLUS
INTRAVENOUS | Status: AC
  Filled 2024-08-19: qty 20

## 2024-08-19 MED ORDER — FENTANYL CITRATE (PF) 100 MCG/2ML IJ SOLN
INTRAMUSCULAR | Status: AC
  Filled 2024-08-19: qty 2

## 2024-08-19 NOTE — H&P (Signed)
 History and physical reviewed and will be scanned in later. No change in medical status reported by the patient or family, appears stable for surgery. All questions regarding the procedure answered, and patient (or family if a child) expressed understanding of the procedure. ? ?Katie Espinoza ?@TODAY @ ?

## 2024-08-19 NOTE — Anesthesia Postprocedure Evaluation (Signed)
"   Anesthesia Post Note  Patient: Katie Espinoza  Procedure(s) Performed: SINUS SURGERY, WITH IMAGING GUIDANCE (Bilateral: Nose) MAXILLARY ANTROSTOMY (Left: Nose)  Patient location during evaluation: PACU Anesthesia Type: General Level of consciousness: awake and alert Pain management: pain level controlled Vital Signs Assessment: post-procedure vital signs reviewed and stable Respiratory status: spontaneous breathing, nonlabored ventilation, respiratory function stable and patient connected to nasal cannula oxygen Cardiovascular status: blood pressure returned to baseline and stable Postop Assessment: no apparent nausea or vomiting Anesthetic complications: no   No notable events documented.   Last Vitals:  Vitals:   08/19/24 1135 08/19/24 1145  BP:  133/62  Pulse: 65 86  Resp: (!) 9 18  Temp:    SpO2: 100% 97%    Last Pain:  Vitals:   08/19/24 1145  PainSc: 0-No pain                 Fairy A Shawnte Winton      "

## 2024-08-19 NOTE — Op Note (Signed)
 08/19/2024  11:09 AM    Katie Espinoza  [id number]   Pre-Op Diagnosis:  CHRONIC SINUSITIS, LEFT MAXILLARY SINUS  Post-op Diagnosis: CHRONIC SINUSITIS, LEFT MAXILLARY SINUS  Procedure:  1)  Image Guided Sinus Surgery,   2)  Left Endoscopic Maxillary Antrostomy with Tissue Removal      Surgeon:  Deward GORMAN Dolly  Anesthesia:  General endotracheal  EBL:  less than 10 cc  Complications:  None  Findings: Large obstructive cyst in left maxillary sinus. No purulence  Procedure: After the patient was identified in holding and the benefits of the procedure were reviewed as well as the consent and risks, the patient was taken to the operating room and with the patient in a comfortable supine position,  general orotracheal anesthesia was induced without difficulty.  A proper time-out was performed.  The Trudi image guidance system was set up and calibrated in the normal fashion and felt to be acceptable.  Next 1% Xylocaine  with 1:100,000 epinephrine  was infiltrated into the left anterior middle turbinate and uncinate.  Several minutes were allowed for this to take effect.  Cottonoid pledgets soaked in Afrin were placed into the left nasal cavitity and left while the patient was prepped and draped in the standard fashion. The image guided suction was calibrated and used to inspect known points in the nasal cavity to assess accuracy of the image guided system. Accuracy was felt to be excellent.   The left middle turbinate was medialized and the uncinate process then resected with through-cutting forceps as well as the microdebrider. In this fashion the uncinate was completely removed along with soft tissue and bone of the medial wall of the maxillary sinus to create a large patent maxillary antrostomy. There was a large polypoid mass in the sinus. This was grasped with forceps and removed, and appeared to be more of a cyst, as it ruptured, draining some clear mucous. This was removed completely. The  left maxillary sinus was irrigated and suctioned to clear secretions. There was no purulent material. A 70 scope was used to inspect the sinus, which was clear at this point.   Bleeding was well controlled with no need for packing.   The patient was then returned to the anesthesiologist for awakening and taken to recovery room in good condition postoperatively.  Disposition:   PACU and d/c home  Plan: Ice, elevation, narcotic analgesia and prophylactic antibiotics. Begin sinus irrigations with saline tomorrow, irrigating 3-4 times daily. Return to the office in 7 days.  Return to work in 7-10 days, no strenuous activities for two weeks.   Deward GORMAN Dolly 08/19/2024 11:09 AM

## 2024-08-19 NOTE — H&P (Signed)
 Katie Espinoza, [place] [id number] Jun 10, 1966  Date of Admission: @TODAY @ Admitting Physician: Deward GORMAN Katie Espinoza  Chief Complaint: Chronic left maxillary sinusitis  HPI: This 59 y.o. year old female has a history of chronic sinusitis involving the left maxillary sinus, unresponsive to medical management  Medications:  No medications prior to admission.    Allergies: Allergies[1]  PMH:  Past Medical History:  Diagnosis Date   Arthritis    Chronic constipation    Heavy menstrual bleeding    Iron  deficiency anemia    IV Iron  infusions--  last infusion 04-23-2015   PONV (postoperative nausea and vomiting)    Uterine fibroid    Wears glasses     Fam Hx:  Family History  Problem Relation Age of Onset   Cancer Father        colon   Colon cancer Father 92   Hyperlipidemia Mother    Colon cancer Paternal Uncle 22   Heart Problems Maternal Grandfather    Esophageal cancer Neg Hx    Stomach cancer Neg Hx    Rectal cancer Neg Hx     Soc Hx:  Social History   Socioeconomic History   Marital status: Married    Spouse name: Not on file   Number of children: 2   Years of education: Not on file   Highest education level: Not on file  Occupational History   Occupation: Designer, [employer]: [employer]  Tobacco Use   Smoking status: Former    Current packs/day: 0.00    Types: Cigarettes    Start date: 07/31/1988    Quit date: 07/31/1990    Years since quitting: 34.0   Smokeless tobacco: Never  Vaping Use   Vaping status: Never Used  Substance and Sexual Activity   Alcohol use: Yes    Alcohol/week: 0.0 standard drinks of alcohol    Comment: rare   Drug use: Never   Sexual activity: Not on file  Other Topics Concern   Not on file  Social History Narrative   Not on file   Social Drivers of Health   Tobacco Use: Medium Risk (08/19/2024)   Patient History    Smoking Tobacco Use: Former    Smokeless Tobacco Use: Never    Passive Exposure: Not on Programmer, Applications Strain: Not on file  Food Insecurity: Not on file  Transportation Needs: Not on file  Physical Activity: Not on file  Stress: Not on file  Social Connections: Not on file  Intimate Partner Violence: Not on file  Depression (PHQ2-9): Low Risk (05/19/2022)   Depression (PHQ2-9)    PHQ-2 Score: 4  Alcohol Screen: Not on file  Housing: Not on file  Utilities: Not on file  Health Literacy: Not on file    PSH:  Past Surgical History:  Procedure Laterality Date   ABDOMINAL HYSTERECTOMY     BILATERAL SALPINGECTOMY Bilateral 05/04/2015   Procedure: BILATERAL SALPINGECTOMY;  Surgeon: Duwaine Blumenthal, DO;  Location: [facility];  Service: Gynecology;  Laterality: Bilateral;   BREAST BIOPSY Left 2017   BREAST BIOPSY Right    BREAST BIOPSY Left 06/21/2022   US  LT BREAST BX W LOC DEV 1ST LESION IMG BX SPEC US  GUIDE 06/21/2022 [facility]MO[facility]   BREAST LUMPECTOMY Left 06/13/2018   Procedure: LEFT RETROAREOLAR  LUMPECTOMY;  Surgeon: Belinda Cough, MD;  Location: [facility];  Service: General;  Laterality: Left;   COLONOSCOPY  2007   CYSTOSCOPY N/A 05/04/2015  Procedure: CYSTOSCOPY;  Surgeon: Duwaine Blumenthal, DO;  [facility];  Service: Gynecology;  Laterality: N/A;   DILATATION & CURETTAGE/HYSTEROSCOPY WITH TRUECLEAR N/A 10/22/2013   Procedure: DILATATION & CURETTAGE/HYSTEROSCOPY WITH TRUCLEAR;  Surgeon: Duwaine Blumenthal, DO;  Location: [facility];  Service: Gynecology;  Laterality: N/A;   LAPAROSCOPIC ASSISTED VAGINAL HYSTERECTOMY N/A 05/04/2015   Procedure: LAPAROSCOPIC ASSISTED VAGINAL HYSTERECTOMY;  Surgeon: Duwaine Blumenthal, DO;  Location: [facility];  Service: Gynecology;  Laterality: N/A;  need bed  .   PHYSICAL EXAM  Vitals: Blood pressure 110/64, pulse 65, temperature 98.6 F (37 C), height 5' 6 (1.676 m), weight 86.2 kg, last menstrual period 04/26/2015, SpO2 98%.. General: Well-developed, Well-nourished in  no acute distress Mood: Mood and affect well adjusted, pleasant and cooperative. Orientation: Grossly alert and oriented. Vocal Quality: No hoarseness. Communicates verbally. head and Face: NCAT. No facial asymmetry. No visible skin lesions. No significant facial scars. No tenderness with sinus percussion. Facial strength normal and symmetric. Respiratory: Normal respiratory effort without labored breathing. Lungs CTA bilaterally Cardiovascular: Heart exam shows regular rate and rhythm  ASSESSMENT: Chronic Left maxillary sinusitis  PLAN: Left endoscopic maxillary antrostomy with tissue removal, image guided surgery   Deward GORMAN Katie Espinoza 08/19/2024 10:25 AM     [1]  Allergies Allergen Reactions   Erythromycin Rash   Penicillins Rash    06/13/18 received 2 Gms Ancef  with no apparent reaction

## 2024-08-19 NOTE — Anesthesia Preprocedure Evaluation (Signed)
"                                    Anesthesia Evaluation  Patient identified by MRN, date of birth, ID band Patient awake    Reviewed: Allergy & Precautions, H&P , NPO status , Patient's Chart, lab work & pertinent test results  Airway Mallampati: II  TM Distance: >3 FB Neck ROM: Full    Dental no notable dental hx. (+) Teeth Intact   Pulmonary neg pulmonary ROS, former smoker   Pulmonary exam normal breath sounds clear to auscultation       Cardiovascular negative cardio ROS Normal cardiovascular exam Rhythm:Regular Rate:Normal     Neuro/Psych negative neurological ROS  negative psych ROS   GI/Hepatic negative GI ROS, Neg liver ROS,,,  Endo/Other  negative endocrine ROS    Renal/GU negative Renal ROS  negative genitourinary   Musculoskeletal negative musculoskeletal ROS (+)    Abdominal   Peds negative pediatric ROS (+)  Hematology negative hematology ROS (+)   Anesthesia Other Findings   Reproductive/Obstetrics negative OB ROS                              Anesthesia Physical Anesthesia Plan  ASA: 1  Anesthesia Plan: General   Post-op Pain Management:    Induction: Intravenous  PONV Risk Score and Plan:   Airway Management Planned:   Additional Equipment:   Intra-op Plan:   Post-operative Plan: Extubation in OR  Informed Consent: I have reviewed the patients History and Physical, chart, labs and discussed the procedure including the risks, benefits and alternatives for the proposed anesthesia with the patient or authorized representative who has indicated his/her understanding and acceptance.     Dental advisory given  Plan Discussed with: CRNA  Anesthesia Plan Comments:         Anesthesia Quick Evaluation  "

## 2024-08-19 NOTE — Anesthesia Procedure Notes (Signed)
 Procedure Name: Intubation Date/Time: 08/19/2024 10:34 AM  Performed by: Pearl Ozell PARAS, CRNAPre-anesthesia Checklist: Patient identified, Emergency Drugs available, Suction available, Patient being monitored and Timeout performed Patient Re-evaluated:Patient Re-evaluated prior to induction Oxygen Delivery Method: Circle system utilized Preoxygenation: Pre-oxygenation with 100% oxygen Induction Type: IV induction Ventilation: Mask ventilation without difficulty Laryngoscope Size: Miller and 2 Grade View: Grade I Tube type: Oral Rae Tube size: 7.0 mm Number of attempts: 1 Placement Confirmation: ETT inserted through vocal cords under direct vision, positive ETCO2 and breath sounds checked- equal and bilateral Tube secured with: Tape Dental Injury: Teeth and Oropharynx as per pre-operative assessment

## 2024-08-19 NOTE — Transfer of Care (Signed)
 Immediate Anesthesia Transfer of Care Note  Patient: Katie Espinoza  Procedure(s) Performed: SINUS SURGERY, WITH IMAGING GUIDANCE (Bilateral: Nose) MAXILLARY ANTROSTOMY (Left: Nose)  Patient Location: PACU  Anesthesia Type: General  Level of Consciousness: awake, alert  and patient cooperative  Airway and Oxygen Therapy: Patient Spontanous Breathing and Patient connected to supplemental oxygen  Post-op Assessment: Post-op Vital signs reviewed, Patient's Cardiovascular Status Stable, Respiratory Function Stable, Patent Airway and No signs of Nausea or vomiting  Post-op Vital Signs: Reviewed and stable  Complications: No notable events documented.
